# Patient Record
Sex: Male | Born: 1950 | Race: White | Hispanic: No | Marital: Married | Smoking: Former smoker
Health system: Southern US, Community
[De-identification: ages and names within clinical notes are randomized; demographics above are authoritative.]

## PROBLEM LIST (undated history)

## (undated) DIAGNOSIS — S31809A Unspecified open wound of unspecified buttock, initial encounter: Secondary | ICD-10-CM

## (undated) DIAGNOSIS — R6521 Severe sepsis with septic shock: Secondary | ICD-10-CM

## (undated) DIAGNOSIS — I4821 Permanent atrial fibrillation: Secondary | ICD-10-CM

## (undated) DIAGNOSIS — I4891 Unspecified atrial fibrillation: Secondary | ICD-10-CM

## (undated) DIAGNOSIS — E119 Type 2 diabetes mellitus without complications: Secondary | ICD-10-CM

## (undated) DIAGNOSIS — I10 Essential (primary) hypertension: Secondary | ICD-10-CM

## (undated) DIAGNOSIS — A419 Sepsis, unspecified organism: Secondary | ICD-10-CM

## (undated) DIAGNOSIS — I96 Gangrene, not elsewhere classified: Secondary | ICD-10-CM

## (undated) HISTORY — PX: TONSILLECTOMY: SUR1361

## (undated) HISTORY — PX: TESTICLE SURGERY: SHX794

## (undated) HISTORY — PX: IRRIGATION AND DEBRIDEMENT BUTTOCKS: SHX6601

---

## 2017-05-29 DIAGNOSIS — A419 Sepsis, unspecified organism: Secondary | ICD-10-CM

## 2017-05-29 DIAGNOSIS — S31809A Unspecified open wound of unspecified buttock, initial encounter: Secondary | ICD-10-CM

## 2017-05-29 HISTORY — DX: Sepsis, unspecified organism: A41.9

## 2017-05-29 HISTORY — DX: Unspecified open wound of unspecified buttock, initial encounter: S31.809A

## 2017-06-16 ENCOUNTER — Inpatient Hospital Stay
Admission: AD | Admit: 2017-06-16 | Discharge: 2017-06-23 | Disposition: A | Discharge status: Other Acute Inpatient Hospital | Payer: Self-pay | Source: Ambulatory Visit | Attending: Internal Medicine | Admitting: Internal Medicine

## 2017-06-17 LAB — CBC WITH DIFFERENTIAL/PLATELET
BASOS PCT: 0 %
Basophils Absolute: 0 10*3/uL (ref 0.0–0.1)
EOS ABS: 0.2 10*3/uL (ref 0.0–0.7)
EOS PCT: 2 %
HCT: 26.8 % — ABNORMAL LOW (ref 39.0–52.0)
Hemoglobin: 8.5 g/dL — ABNORMAL LOW (ref 13.0–17.0)
LYMPHS ABS: 1.1 10*3/uL (ref 0.7–4.0)
Lymphocytes Relative: 9 %
MCH: 30.8 pg (ref 26.0–34.0)
MCHC: 31.7 g/dL (ref 30.0–36.0)
MCV: 97.1 fL (ref 78.0–100.0)
MONOS PCT: 10 %
Monocytes Absolute: 1.3 10*3/uL — ABNORMAL HIGH (ref 0.1–1.0)
Neutro Abs: 10 10*3/uL — ABNORMAL HIGH (ref 1.7–7.7)
Neutrophils Relative %: 79 %
PLATELETS: 262 10*3/uL (ref 150–400)
RBC: 2.76 MIL/uL — AB (ref 4.22–5.81)
RDW: 17.1 % — ABNORMAL HIGH (ref 11.5–15.5)
WBC: 12.2 10*3/uL — AB (ref 4.0–10.5)

## 2017-06-17 LAB — COMPREHENSIVE METABOLIC PANEL
ALT: 23 U/L (ref 17–63)
AST: 25 U/L (ref 15–41)
Albumin: 1.4 g/dL — ABNORMAL LOW (ref 3.5–5.0)
Alkaline Phosphatase: 120 U/L (ref 38–126)
Anion gap: 8 (ref 5–15)
BUN: 19 mg/dL (ref 6–20)
CHLORIDE: 98 mmol/L — AB (ref 101–111)
CO2: 25 mmol/L (ref 22–32)
Calcium: 8 mg/dL — ABNORMAL LOW (ref 8.9–10.3)
Creatinine, Ser: 1.25 mg/dL — ABNORMAL HIGH (ref 0.61–1.24)
GFR, EST NON AFRICAN AMERICAN: 60 mL/min — AB (ref >60)
Glucose, Bld: 259 mg/dL — ABNORMAL HIGH (ref 65–99)
POTASSIUM: 5 mmol/L (ref 3.5–5.1)
Sodium: 131 mmol/L — ABNORMAL LOW (ref 135–145)
Total Bilirubin: 0.7 mg/dL (ref 0.3–1.2)
Total Protein: 5.3 g/dL — ABNORMAL LOW (ref 6.5–8.1)

## 2017-06-17 LAB — MAGNESIUM: MAGNESIUM: 1.5 mg/dL — AB (ref 1.7–2.4)

## 2017-06-19 LAB — BASIC METABOLIC PANEL
ANION GAP: 9 (ref 5–15)
BUN: 19 mg/dL (ref 6–20)
CALCIUM: 7.8 mg/dL — AB (ref 8.9–10.3)
CO2: 25 mmol/L (ref 22–32)
CREATININE: 1.08 mg/dL (ref 0.61–1.24)
Chloride: 99 mmol/L — ABNORMAL LOW (ref 101–111)
Glucose, Bld: 170 mg/dL — ABNORMAL HIGH (ref 65–99)
Potassium: 4.4 mmol/L (ref 3.5–5.1)
Sodium: 133 mmol/L — ABNORMAL LOW (ref 135–145)

## 2017-06-19 LAB — MAGNESIUM: Magnesium: 1.6 mg/dL — ABNORMAL LOW (ref 1.7–2.4)

## 2017-06-20 LAB — URINALYSIS, ROUTINE W REFLEX MICROSCOPIC
Bilirubin Urine: NEGATIVE
GLUCOSE, UA: NEGATIVE mg/dL
Hgb urine dipstick: NEGATIVE
Ketones, ur: NEGATIVE mg/dL
Leukocytes, UA: NEGATIVE
Nitrite: NEGATIVE
PROTEIN: 100 mg/dL — AB
Specific Gravity, Urine: 1.016 (ref 1.005–1.030)
pH: 5 (ref 5.0–8.0)

## 2017-06-20 LAB — MAGNESIUM: MAGNESIUM: 1.8 mg/dL (ref 1.7–2.4)

## 2017-06-21 LAB — URINE CULTURE: CULTURE: NO GROWTH

## 2017-06-22 LAB — CBC
HCT: 25.8 % — ABNORMAL LOW (ref 39.0–52.0)
HEMOGLOBIN: 8.1 g/dL — AB (ref 13.0–17.0)
MCH: 29.9 pg (ref 26.0–34.0)
MCHC: 31.4 g/dL (ref 30.0–36.0)
MCV: 95.2 fL (ref 78.0–100.0)
Platelets: 412 10*3/uL — ABNORMAL HIGH (ref 150–400)
RBC: 2.71 MIL/uL — ABNORMAL LOW (ref 4.22–5.81)
RDW: 16.3 % — ABNORMAL HIGH (ref 11.5–15.5)
WBC: 8.9 10*3/uL (ref 4.0–10.5)

## 2017-06-22 LAB — BASIC METABOLIC PANEL
Anion gap: 7 (ref 5–15)
BUN: 21 mg/dL — AB (ref 6–20)
CHLORIDE: 97 mmol/L — AB (ref 101–111)
CO2: 29 mmol/L (ref 22–32)
CREATININE: 1.09 mg/dL (ref 0.61–1.24)
Calcium: 8.4 mg/dL — ABNORMAL LOW (ref 8.9–10.3)
GFR calc Af Amer: >60 mL/min (ref >60)
GFR calc non Af Amer: >60 mL/min (ref >60)
GLUCOSE: 151 mg/dL — AB (ref 65–99)
POTASSIUM: 4.7 mmol/L (ref 3.5–5.1)
SODIUM: 133 mmol/L — AB (ref 135–145)

## 2017-06-22 LAB — C-REACTIVE PROTEIN: CRP: 19.1 mg/dL — ABNORMAL HIGH (ref <1.0)

## 2017-06-22 LAB — MAGNESIUM: MAGNESIUM: 1.7 mg/dL (ref 1.7–2.4)

## 2017-06-22 LAB — PHOSPHORUS: Phosphorus: 4 mg/dL (ref 2.5–4.6)

## 2017-06-22 LAB — SEDIMENTATION RATE: SED RATE: 130 mm/h — AB (ref 0–16)

## 2017-06-23 ENCOUNTER — Emergency Department (HOSPITAL_COMMUNITY): Payer: Medicare Other

## 2017-06-23 ENCOUNTER — Encounter (HOSPITAL_COMMUNITY): Payer: Self-pay | Admitting: General Practice

## 2017-06-23 ENCOUNTER — Inpatient Hospital Stay (HOSPITAL_COMMUNITY)
Admission: EM | Admit: 2017-06-23 | Discharge: 2017-06-27 | DRG: 981 | Disposition: A | Discharge status: Other Acute Inpatient Hospital | Payer: Medicare Other | Attending: Internal Medicine | Admitting: Internal Medicine

## 2017-06-23 DIAGNOSIS — E1140 Type 2 diabetes mellitus with diabetic neuropathy, unspecified: Secondary | ICD-10-CM | POA: Diagnosis present

## 2017-06-23 DIAGNOSIS — I481 Persistent atrial fibrillation: Secondary | ICD-10-CM | POA: Diagnosis present

## 2017-06-23 DIAGNOSIS — M549 Dorsalgia, unspecified: Secondary | ICD-10-CM | POA: Diagnosis present

## 2017-06-23 DIAGNOSIS — Z79899 Other long term (current) drug therapy: Secondary | ICD-10-CM

## 2017-06-23 DIAGNOSIS — I4821 Permanent atrial fibrillation: Secondary | ICD-10-CM | POA: Diagnosis present

## 2017-06-23 DIAGNOSIS — I2510 Atherosclerotic heart disease of native coronary artery without angina pectoris: Secondary | ICD-10-CM | POA: Diagnosis present

## 2017-06-23 DIAGNOSIS — G8929 Other chronic pain: Secondary | ICD-10-CM | POA: Diagnosis present

## 2017-06-23 DIAGNOSIS — I4510 Unspecified right bundle-branch block: Secondary | ICD-10-CM | POA: Diagnosis present

## 2017-06-23 DIAGNOSIS — N183 Chronic kidney disease, stage 3 (moderate): Secondary | ICD-10-CM | POA: Diagnosis present

## 2017-06-23 DIAGNOSIS — R1310 Dysphagia, unspecified: Secondary | ICD-10-CM | POA: Diagnosis present

## 2017-06-23 DIAGNOSIS — L89154 Pressure ulcer of sacral region, stage 4: Secondary | ICD-10-CM | POA: Diagnosis present

## 2017-06-23 DIAGNOSIS — F329 Major depressive disorder, single episode, unspecified: Secondary | ICD-10-CM | POA: Diagnosis present

## 2017-06-23 DIAGNOSIS — F419 Anxiety disorder, unspecified: Secondary | ICD-10-CM | POA: Diagnosis present

## 2017-06-23 DIAGNOSIS — Z6837 Body mass index (BMI) 37.0-37.9, adult: Secondary | ICD-10-CM

## 2017-06-23 DIAGNOSIS — D72829 Elevated white blood cell count, unspecified: Secondary | ICD-10-CM | POA: Diagnosis not present

## 2017-06-23 DIAGNOSIS — Z87891 Personal history of nicotine dependence: Secondary | ICD-10-CM | POA: Diagnosis not present

## 2017-06-23 DIAGNOSIS — E871 Hypo-osmolality and hyponatremia: Secondary | ICD-10-CM | POA: Diagnosis present

## 2017-06-23 DIAGNOSIS — E441 Mild protein-calorie malnutrition: Secondary | ICD-10-CM | POA: Diagnosis present

## 2017-06-23 DIAGNOSIS — I482 Chronic atrial fibrillation: Secondary | ICD-10-CM | POA: Diagnosis not present

## 2017-06-23 DIAGNOSIS — E1152 Type 2 diabetes mellitus with diabetic peripheral angiopathy with gangrene: Secondary | ICD-10-CM | POA: Diagnosis present

## 2017-06-23 DIAGNOSIS — R159 Full incontinence of feces: Secondary | ICD-10-CM | POA: Diagnosis present

## 2017-06-23 DIAGNOSIS — L8990 Pressure ulcer of unspecified site, unspecified stage: Secondary | ICD-10-CM | POA: Insufficient documentation

## 2017-06-23 DIAGNOSIS — N493 Fournier gangrene: Secondary | ICD-10-CM | POA: Diagnosis present

## 2017-06-23 DIAGNOSIS — G8220 Paraplegia, unspecified: Secondary | ICD-10-CM | POA: Diagnosis present

## 2017-06-23 DIAGNOSIS — E785 Hyperlipidemia, unspecified: Secondary | ICD-10-CM | POA: Diagnosis present

## 2017-06-23 DIAGNOSIS — G4730 Sleep apnea, unspecified: Secondary | ICD-10-CM | POA: Diagnosis present

## 2017-06-23 DIAGNOSIS — D539 Nutritional anemia, unspecified: Secondary | ICD-10-CM | POA: Diagnosis present

## 2017-06-23 DIAGNOSIS — R52 Pain, unspecified: Secondary | ICD-10-CM | POA: Diagnosis not present

## 2017-06-23 DIAGNOSIS — I129 Hypertensive chronic kidney disease with stage 1 through stage 4 chronic kidney disease, or unspecified chronic kidney disease: Secondary | ICD-10-CM | POA: Diagnosis present

## 2017-06-23 DIAGNOSIS — Z794 Long term (current) use of insulin: Secondary | ICD-10-CM

## 2017-06-23 DIAGNOSIS — S31809A Unspecified open wound of unspecified buttock, initial encounter: Secondary | ICD-10-CM | POA: Diagnosis present

## 2017-06-23 DIAGNOSIS — E1122 Type 2 diabetes mellitus with diabetic chronic kidney disease: Secondary | ICD-10-CM | POA: Diagnosis present

## 2017-06-23 DIAGNOSIS — E1165 Type 2 diabetes mellitus with hyperglycemia: Secondary | ICD-10-CM | POA: Diagnosis present

## 2017-06-23 HISTORY — DX: Gangrene, not elsewhere classified: I96

## 2017-06-23 HISTORY — DX: Type 2 diabetes mellitus without complications: E11.9

## 2017-06-23 HISTORY — DX: Unspecified atrial fibrillation: I48.91

## 2017-06-23 HISTORY — DX: Unspecified open wound of unspecified buttock, initial encounter: S31.809A

## 2017-06-23 HISTORY — DX: Severe sepsis with septic shock: R65.21

## 2017-06-23 HISTORY — DX: Permanent atrial fibrillation: I48.21

## 2017-06-23 HISTORY — DX: Essential (primary) hypertension: I10

## 2017-06-23 HISTORY — DX: Sepsis, unspecified organism: A41.9

## 2017-06-23 LAB — CBC WITH DIFFERENTIAL/PLATELET
BASOS ABS: 0 10*3/uL (ref 0.0–0.1)
Basophils Relative: 0 %
Eosinophils Absolute: 0.8 10*3/uL — ABNORMAL HIGH (ref 0.0–0.7)
Eosinophils Relative: 6 %
HCT: 29.2 % — ABNORMAL LOW (ref 39.0–52.0)
Hemoglobin: 9.2 g/dL — ABNORMAL LOW (ref 13.0–17.0)
LYMPHS ABS: 1.4 10*3/uL (ref 0.7–4.0)
Lymphocytes Relative: 11 %
MCH: 29.6 pg (ref 26.0–34.0)
MCHC: 31.5 g/dL (ref 30.0–36.0)
MCV: 93.9 fL (ref 78.0–100.0)
MONO ABS: 1.7 10*3/uL — AB (ref 0.1–1.0)
Monocytes Relative: 13 %
NEUTROS PCT: 70 %
Neutro Abs: 9.1 10*3/uL — ABNORMAL HIGH (ref 1.7–7.7)
PLATELETS: 413 10*3/uL — AB (ref 150–400)
RBC: 3.11 MIL/uL — ABNORMAL LOW (ref 4.22–5.81)
RDW: 15.9 % — AB (ref 11.5–15.5)
WBC: 13 10*3/uL — AB (ref 4.0–10.5)

## 2017-06-23 LAB — BASIC METABOLIC PANEL
ANION GAP: 9 (ref 5–15)
BUN: 25 mg/dL — ABNORMAL HIGH (ref 6–20)
CALCIUM: 8.5 mg/dL — AB (ref 8.9–10.3)
CO2: 26 mmol/L (ref 22–32)
CREATININE: 1.11 mg/dL (ref 0.61–1.24)
Chloride: 97 mmol/L — ABNORMAL LOW (ref 101–111)
Glucose, Bld: 139 mg/dL — ABNORMAL HIGH (ref 65–99)
Potassium: 4.8 mmol/L (ref 3.5–5.1)
SODIUM: 132 mmol/L — AB (ref 135–145)

## 2017-06-23 LAB — APTT: aPTT: 40 seconds — ABNORMAL HIGH (ref 24–36)

## 2017-06-23 LAB — GLUCOSE, CAPILLARY: GLUCOSE-CAPILLARY: 142 mg/dL — AB (ref 65–99)

## 2017-06-23 LAB — MAGNESIUM: Magnesium: 1.6 mg/dL — ABNORMAL LOW (ref 1.7–2.4)

## 2017-06-23 MED ORDER — HEPARIN SODIUM (PORCINE) 5000 UNIT/ML IJ SOLN
5000.0000 [IU] | Freq: Three times a day (TID) | INTRAMUSCULAR | Status: DC
  Administered 2017-06-24 – 2017-06-25 (×2): 5000 [IU] via SUBCUTANEOUS
  Filled 2017-06-23 (×2): qty 1

## 2017-06-23 MED ORDER — MORPHINE SULFATE (PF) 2 MG/ML IV SOLN
2.0000 mg | Freq: Four times a day (QID) | INTRAVENOUS | Status: DC | PRN
  Administered 2017-06-25 – 2017-06-27 (×6): 2 mg via INTRAVENOUS
  Filled 2017-06-23 (×7): qty 1

## 2017-06-23 MED ORDER — METOPROLOL TARTRATE 12.5 MG HALF TABLET
12.5000 mg | ORAL_TABLET | Freq: Four times a day (QID) | ORAL | Status: DC
  Administered 2017-06-23 – 2017-06-24 (×3): 12.5 mg via ORAL
  Filled 2017-06-23 (×3): qty 1

## 2017-06-23 MED ORDER — VITAMIN C 500 MG PO TABS
500.0000 mg | ORAL_TABLET | Freq: Two times a day (BID) | ORAL | Status: DC
  Administered 2017-06-24 – 2017-06-27 (×6): 500 mg via ORAL
  Filled 2017-06-23 (×6): qty 1

## 2017-06-23 MED ORDER — CEFOTETAN DISODIUM-DEXTROSE 2-2.08 GM-% IV SOLR: 2.0000 g | INTRAVENOUS | Status: DC

## 2017-06-23 MED ORDER — OXYCODONE HCL 5 MG PO TABS
5.0000 mg | ORAL_TABLET | Freq: Four times a day (QID) | ORAL | Status: DC | PRN
  Administered 2017-06-23: 5 mg via ORAL
  Filled 2017-06-23 (×2): qty 1

## 2017-06-23 MED ORDER — ACETAMINOPHEN 325 MG PO TABS: 650.0000 mg | ORAL_TABLET | Freq: Four times a day (QID) | ORAL | Status: DC | PRN

## 2017-06-23 MED ORDER — ALPRAZOLAM 0.5 MG PO TABS
0.5000 mg | ORAL_TABLET | Freq: Three times a day (TID) | ORAL | Status: DC | PRN
  Administered 2017-06-23 – 2017-06-26 (×5): 0.5 mg via ORAL
  Filled 2017-06-23 (×5): qty 1

## 2017-06-23 MED ORDER — ZINC SULFATE 220 (50 ZN) MG PO CAPS
220.0000 mg | ORAL_CAPSULE | Freq: Every day | ORAL | Status: DC
  Administered 2017-06-25 – 2017-06-27 (×3): 220 mg via ORAL
  Filled 2017-06-23 (×3): qty 1

## 2017-06-23 MED ORDER — HYDROMORPHONE HCL 1 MG/ML IJ SOLN
1.0000 mg | Freq: Once | INTRAMUSCULAR | Status: AC
  Administered 2017-06-23: 1 mg via INTRAVENOUS
  Filled 2017-06-23: qty 1

## 2017-06-23 MED ORDER — DOCUSATE SODIUM 100 MG PO CAPS
100.0000 mg | ORAL_CAPSULE | Freq: Every day | ORAL | Status: DC
Start: 2017-06-24 — End: 2017-06-25

## 2017-06-23 MED ORDER — ATORVASTATIN CALCIUM 40 MG PO TABS
40.0000 mg | ORAL_TABLET | Freq: Every day | ORAL | Status: DC
  Administered 2017-06-25 – 2017-06-27 (×3): 40 mg via ORAL
  Filled 2017-06-23 (×3): qty 1

## 2017-06-23 MED ORDER — ACETAMINOPHEN 650 MG RE SUPP: 650.0000 mg | Freq: Four times a day (QID) | RECTAL | Status: DC | PRN

## 2017-06-23 MED ORDER — POLYETHYLENE GLYCOL 3350 17 G PO PACK
17.0000 g | PACK | Freq: Every day | ORAL | Status: DC | PRN
  Administered 2017-06-23 (×2): 17 g via ORAL
  Filled 2017-06-23: qty 1

## 2017-06-23 MED ORDER — MORPHINE SULFATE (PF) 4 MG/ML IV SOLN: 6.0000 mg | Freq: Once | INTRAVENOUS | Status: DC

## 2017-06-23 MED ORDER — ATORVASTATIN CALCIUM 20 MG PO TABS: 20.0000 mg | ORAL_TABLET | Freq: Every day | ORAL | Status: DC

## 2017-06-23 MED ORDER — INSULIN ASPART 100 UNIT/ML ~~LOC~~ SOLN: 0.0000 [IU] | Freq: Every day | SUBCUTANEOUS | Status: DC

## 2017-06-23 MED ORDER — HEPARIN SODIUM (PORCINE) 5000 UNIT/ML IJ SOLN
5000.0000 [IU] | Freq: Three times a day (TID) | INTRAMUSCULAR | Status: AC
  Administered 2017-06-23: 5000 [IU] via SUBCUTANEOUS
  Filled 2017-06-23: qty 1

## 2017-06-23 MED ORDER — FLUOXETINE HCL 20 MG PO CAPS
20.0000 mg | ORAL_CAPSULE | Freq: Two times a day (BID) | ORAL | Status: DC
  Administered 2017-06-24 – 2017-06-27 (×6): 20 mg via ORAL
  Filled 2017-06-23 (×6): qty 1

## 2017-06-23 MED ORDER — ASPIRIN 325 MG PO TABS: 325.0000 mg | ORAL_TABLET | Freq: Every day | ORAL | Status: DC

## 2017-06-23 MED ORDER — FAMOTIDINE 20 MG PO TABS
20.0000 mg | ORAL_TABLET | Freq: Two times a day (BID) | ORAL | Status: DC
Start: 2017-06-24 — End: 2017-06-27
  Administered 2017-06-24 – 2017-06-27 (×6): 20 mg via ORAL
  Filled 2017-06-23 (×6): qty 1

## 2017-06-23 MED ORDER — ACETAMINOPHEN 325 MG PO TABS
650.0000 mg | ORAL_TABLET | Freq: Four times a day (QID) | ORAL | Status: DC | PRN
  Administered 2017-06-25: 650 mg via ORAL
  Filled 2017-06-23: qty 2

## 2017-06-23 MED ORDER — ASPIRIN 81 MG PO CHEW
81.0000 mg | CHEWABLE_TABLET | Freq: Every day | ORAL | Status: DC
  Administered 2017-06-23 – 2017-06-27 (×4): 81 mg via ORAL
  Filled 2017-06-23 (×4): qty 1

## 2017-06-23 MED ORDER — INSULIN ASPART 100 UNIT/ML ~~LOC~~ SOLN
0.0000 [IU] | Freq: Three times a day (TID) | SUBCUTANEOUS | Status: DC
  Administered 2017-06-24 – 2017-06-25 (×2): 4 [IU] via SUBCUTANEOUS

## 2017-06-23 NOTE — Consult Note (Signed)
Reason for Consult:  Request for diverting colostomy Referring Physician: Dr. Owens Shark, [facility] hospital  Alex Abbott is an 66 y.o. male.  HPI: Patient is a 66 year old male who presented to [facility] on 05/30/17 with Fournier's gangrene. He was taken the operating room and septic shock on this date underwent his first debridement. On 05/31/16 he was taken back for second look had a left orchiectomy and a suprapubic catheter placed. On 75 and underwent further debridement of the buttocks. He was returned again on 06/06/17 for debridement. After his debridement on 06/06/17 when he woke he was found to have some paralysis and workup led to finding of a spinal cord infarct with paraplegia. On 06/10/17 he was taken back to the OR and a right thigh pouch was made for his remaining right testicle. Plastics consult at that time recommended partial closure and skin grafting to the penis. On 06/11/17 he underwent rapid response for severe anemia was transfused for hemoglobin of 5.8 up to 8.8. Additional issues include adult-onset diabetes mellitus, atrial fibrillation and obesity. He was stabilized and discharged on 06/16/17 to the LTAC here at [facility]. They are asking we see him and evaluate for possible diverting colostomy.   Past medical history: Fournier's gangrene Chronic atrial fibrillation requiring amiodarone drip Chronic kidney disease stage III GFR 30-59 mils per minute Type 2 diabetes with neuropathy Anemia. Coronary artery disease Hypertension History of bipolar affective disorder/anxiety disorder History tobacco use quit 08/29/12    Past surgical history: None currently available in the records:  None until 05/30/17  Family history: His mother had hypertension and heart failure. One brother with history of cancer  Social History:  has no tobacco, alcohol, and drug history on file. Tobacco:  Up to 2 PPD x 50 plus years ETOH:  None currently, heavy use for 40  years Drugs:  He has tried most of them, none for years.   Allergies:  Propafenone (Rythmol)  Prior to Admission medications   Not on File    Current Facility-Administered Medications:  .  morphine 4 MG/ML injection 6 mg, 6 mg, Intravenous, Once, Winfrey, Jenne Pane, MD No current outpatient prescriptions on file.   Current meds [facility]: Lantus 20 units twice a day MiraLAX 17 g daily Humalog sliding scale Singh sulfate 220 mg daily Vitamin C 500 mg daily pro-stat nutritional liquids 30 mils twice a day  Glucerna shakes 237 Mls daily  mag sulfate1 g  Potassium chloride 40 mEq daily Morphine sulfate 2 mg every 6 hours when necessary Mag oxide 400 mg every 8 hours Kayexalate 15 g per 60 mils 30 g when necessary  Tylenol when necessary  Pepcid 20 mg daily D51/2 NS at 50 ml per hour Xanax 0.5 mg 3 times a day when necessary oxycodone IR 5 mg every 6 hours when necessary  heparin 5000 units every 8 hours subcutaneous Prozac 20 mg twice a day Aspirin 325 mg daily Colace 100 mg daily Atorvastatin 20 mg daily Metoprolol tartrate 25 mg tablets half tablet 4 times a day  Results for orders placed or performed during the hospital encounter of 06/16/17 (from the past 48 hour(s))  CBC     Status: Abnormal   Collection Time: 06/22/17  7:15 AM  Result Value Ref Range   WBC 8.9 4.0 - 10.5 K/uL   RBC 2.71 (L) 4.22 - 5.81 MIL/uL   Hemoglobin 8.1 (L) 13.0 - 17.0 g/dL   HCT 25.8 (L) 39.0 - 52.0 %  MCV 95.2 78.0 - 100.0 fL   MCH 29.9 26.0 - 34.0 pg   MCHC 31.4 30.0 - 36.0 g/dL   RDW 16.3 (H) 11.5 - 15.5 %   Platelets 412 (H) 150 - 400 K/uL  Basic metabolic panel     Status: Abnormal   Collection Time: 06/22/17  7:15 AM  Result Value Ref Range   Sodium 133 (L) 135 - 145 mmol/L   Potassium 4.7 3.5 - 5.1 mmol/L   Chloride 97 (L) 101 - 111 mmol/L   CO2 29 22 - 32 mmol/L   Glucose, Bld 151 (H) 65 - 99 mg/dL   BUN 21 (H) 6 - 20 mg/dL   Creatinine, Ser 1.09 0.61 - 1.24  mg/dL   Calcium 8.4 (L) 8.9 - 10.3 mg/dL   GFR calc non Af Amer >60 >60 mL/min   GFR calc Af Amer >60 >60 mL/min    Comment: (NOTE) The eGFR has been calculated using the CKD EPI equation. This calculation has not been validated in all clinical situations. eGFR's persistently <60 mL/min signify possible Chronic Kidney Disease.    Anion gap 7 5 - 15  Magnesium     Status: None   Collection Time: 06/22/17  7:15 AM  Result Value Ref Range   Magnesium 1.7 1.7 - 2.4 mg/dL  Phosphorus     Status: None   Collection Time: 06/22/17  7:15 AM  Result Value Ref Range   Phosphorus 4.0 2.5 - 4.6 mg/dL  C-reactive protein     Status: Abnormal   Collection Time: 06/22/17  7:15 AM  Result Value Ref Range   CRP 19.1 (H) <1.0 mg/dL  Sedimentation rate     Status: Abnormal   Collection Time: 06/22/17  7:15 AM  Result Value Ref Range   Sed Rate 130 (H) 0 - 16 mm/hr    Dg Chest Portable 1 View  Result Date: 06/23/2017 CLINICAL DATA:  Status post PICC line placement on the right. EXAM: PORTABLE CHEST 1 VIEW COMPARISON:  None in PACs FINDINGS: The PICC line tip terminates at the junction of the right and left brachiocephalic veins. The lungs are adequately inflated. There is no pneumothorax. The retrocardiac region on the left is dense. The cardiac silhouette is enlarged. The pulmonary vascularity is mildly prominent centrally. IMPRESSION: No postprocedure complication following right-sided PICC line placement. The tip of the catheter however terminates at the level of the junction of the right and left brachiocephalic veins. Cardiomegaly with central pulmonary vascular prominence may reflect mild CHF. Electronically Signed   By: David  Martinique M.D.   On: 06/23/2017 11:15    Review of Systems  Constitutional: Negative.   HENT: Negative.   Eyes: Negative for blurred vision, double vision, photophobia, pain, discharge and redness.       Losing vision right eye, secondary to Diabetes, wears glasses.     Respiratory: Positive for cough and shortness of breath. Negative for hemoptysis, sputum production and wheezing.   Cardiovascular: Positive for orthopnea, leg swelling and PND.       Hx of AF, but no cardiac procedures.  Gastrointestinal: Positive for heartburn. Negative for abdominal pain, blood in stool, constipation, diarrhea, melena, nausea and vomiting.  Genitourinary:       He cannot feel below his umbilicus.  Suprapubic catheter in place.    Musculoskeletal:       Paraplegia, no sensation below the umbilicus  Skin: Negative.   Neurological:       Paraplegia, no sensation below  the umbilicus.  He cannot move either leg.  No sensation in his legs.  Endo/Heme/Allergies: Negative.   Psychiatric/Behavioral: Positive for depression. The patient is nervous/anxious.    Blood pressure 109/77, pulse 90, temperature (!) 97.3 F (36.3 C), temperature source Oral, resp. rate (!) 22, height 6' (1.829 m), weight 127 kg (280 lb), SpO2 100 %. Physical Exam  Constitutional: He is oriented to person, place, and time. No distress.  Overtly obese male, in no acute distress. No sensation or pain, no motor function below the umbilicus.  HENT:  Head: Normocephalic and atraumatic.  Mouth/Throat: No oropharyngeal exudate.  Eyes: Right eye exhibits no discharge. Left eye exhibits no discharge. No scleral icterus.  Pupils are equal  Neck: Normal range of motion. Neck supple. No JVD present. No tracheal deviation present. No thyromegaly present.  Cardiovascular: Normal rate, regular rhythm, normal heart sounds and intact distal pulses.   No murmur heard. Respiratory: Effort normal and breath sounds normal. No respiratory distress. He has no wheezes. He has no rales. He exhibits no tenderness.  Decreased BS both bases  GI: Soft. Bowel sounds are normal. He exhibits no distension and no mass. There is no tenderness. There is no rebound and no guarding.  Suprapubic catheter in place, with foley catheter  sewed in.  Some redness from the site.  Genitourinary:  Genitourinary Comments: Extensive dissection as noted below and pictures. There is no rectal tone in the rectum which remains intact. Penis has been debrid.  Musculoskeletal: He exhibits no tenderness or deformity. Edema: 2-3 edema right leg is worse than the right.  Lymphadenopathy:    He has no cervical adenopathy.  Neurological: He is alert and oriented to person, place, and time. No cranial nerve deficit.  No sensation below the umbilicus.  No motor function currently.    Skin: Skin is warm. He is not diaphoretic.  He has had extensive dissection of the anterior  perineum, the penis, the scrotum, both the left and right buttocks up to the up to the coccyx.  The upper portion of the wound will need further debridement.  See pictures below. He has early issues with recurrent taping of the sites.    Psychiatric: He has a normal mood and affect. His behavior is normal. Judgment and thought content normal.       Anterior view:  total depth 10 cm Area has purulent drainage.      Left side down .  Left side down  Coccyx at 9 o'clock     Assessment/Plan: Fournier's gangrene with extensive invasion of the perineum, the penis, left and right buttocks, and the sacrum Sepsis secondary to Fournier's Spinal cord infarction with paraplegia, no motor or sensation below the umbilicus. 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 Chronic atrial fibrillation - aspirin currently for anticoagulation he has declined other anticoagulants CAD - no reported history of MI or prior interventional treatments. Type 2 diabetes - poorly controlled. He reports glucoses in the 500 range prior to hospitalization Hypertension Hyperlipidemia Morbid obesity Probable sleep apnea History of heavy tobacco and alcohol use in the past Remote history of drug use History of bipolar disorder and anxiety disorder. Acute  anemia Hyponatremia  Plan: Patient Referred from [facility] for diverting colostomy. History and exam extensively reviewed by Dr. Windle Guard. Patient will be admitted to the medicine service. We will have wound care nurse see and marked the patient for diverting colostomy. We'll begin twice a day wound care here on  the floor along with hydrotherapy. If medically stable will plan on laparoscopic diverting colostomy tomorrow. He can eat this afternoon, nothing by mouth after midnight. Currently they're having issues getting his pain medications and because of problems with IV access.  , 06/23/2017, 11:30 AM

## 2017-06-23 NOTE — Consult Note (Addendum)
Surgical team following for assessment and plan of care to full thickness wounds to sacrum, perineum, and near penis.  Discussed with PA and additional recommendations provided in the wound care orders.  WOC Nurse requested for preoperative stoma site marking  Discussed surgical procedure and stoma creation with patient.  Explained role of the WOC nurse team.   Answered patient questions. Examined patient while lying in bed in the ER.  He is unable to sit upright or stand, placed head of stretcher up as far as he could tolerate in order to place the marking in the patient's visual field, away from any creases or abdominal contour issues and within the rectus muscle.  Unable to mark below the patient's belt line.   Marked for colostomy in the LLQ  __6__ cm to the left of the umbilicus and __6__cm above the umbilicus.  Patient's abdomen cleansed with CHG wipes at site markings, allowed to air dry prior to marking.Covered mark with thin film transparent dressing to preserve mark until date of surgery. WOC team will plan to follow after surgery for teaching sessions. Cammie Mcgeeawn Charliene Inoue MSN, RN, CWOCN, [place]CN-AP, CNS [phone]

## 2017-06-23 NOTE — H&P (Addendum)
Date: 06/23/2017               Patient Name:  Alex Abbott MRN: [id number]  DOB: August 22, 1951 Age / Sex: 66 y.o., male   PCP: Patient, No Pcp Per         Medical Service: Internal Medicine Teaching Service         Attending Physician: Dr. Earl Lagos, MD    First Contact: Zeb Comfort Pager: [phone]  Second Contact: Dr. John Giovanni Pager: [phone]       After Hours (After 5p/  First Contact Pager: [phone]  weekends / holidays): Second Contact Pager: [phone]   Chief Complaint: non-healing wound   History of Present Illness: Patient is a 66 year old male with history of poorly controlled t2dm, rate-controlled a-fib not on anticoagulation, and chronic pain presenting pre-op prior to diverting colostomy. Patient presented to OSH with Fournier's gangrene of buttocks and groin on 05/30/2017, and underwent a total of five debridements and most recently a pouch creation for his remaining R testicle prior to discharge on 07/19 to the LTAC here at [facility]. LTAC has requested evaluation for possible diverting colostomy as they are concerned for rectal incontinence contributing to ongoing poor wound healing. He has been seen and evaluated by surgery who plan to take him to the OR tomorrow for this procedure.   Today, he also complains of myalgias, general malaise, and dysphagia starting this morning. He experienced subjective chills yesterday but has not had measured fevers. He denies other symptoms on ROS.   Meds:  Current Meds  Medication Sig  . acetaminophen (TYLENOL) 325 MG tablet Take 650 mg by mouth every 6 (six) hours as needed for mild pain.  Marland Kitchen ALPRAZolam (XANAX) 0.5 MG tablet Take 0.5 mg by mouth 3 (three) times daily as needed for anxiety.  . Amino Acids-Protein Hydrolys (PRO-STAT PO) Take 30 mLs by mouth 2 (two) times daily.  Marland Kitchen aspirin 325 MG tablet Take 325 mg by mouth daily.  Marland Kitchen atorvastatin (LIPITOR) 10 MG tablet Take 10 mg by mouth daily.  Marland Kitchen docusate sodium  (COLACE) 100 MG capsule Take 100 mg by mouth daily.  . famotidine (PEPCID) 20 MG tablet Take 20 mg by mouth 2 (two) times daily.  Marland Kitchen FLUoxetine (PROZAC) 20 MG capsule Take 20 mg by mouth 2 (two) times daily.  Marland Kitchen glucagon (GLUCAGON EMERGENCY) 1 MG injection Inject 1 mg into the vein once as needed.  . insulin glargine (LANTUS) 100 UNIT/ML injection Inject 20 Units into the skin 2 (two) times daily.  . insulin lispro (HUMALOG) 100 UNIT/ML injection Inject 0-16 Units into the skin See admin instructions. Per sliding scale for BS 70-150=0 units, 151-200=4 units, 201-250=8 units, 251-300=10 units, 301-350=12 units, 351-400=16 units, BG>400 give 16 units and call MD  . magnesium oxide (MAG-OX) 400 MG tablet Take 400 mg by mouth every 6 (six) hours as needed.  . metoprolol tartrate (LOPRESSOR) 25 MG tablet Take 12.5 mg by mouth 4 (four) times daily.  Marland Kitchen oxycodone (OXY-IR) 5 MG capsule Take 5 mg by mouth every 6 (six) hours as needed for pain.  . polyethylene glycol (MIRALAX / GLYCOLAX) packet Take 17 g by mouth 2 (two) times daily.  . potassium chloride SA (K-DUR,KLOR-CON) 20 MEQ tablet Take 40 mEq by mouth every 4 (four) hours as needed (supplement).  . sodium polystyrene (KAYEXALATE) 15 GM/60ML suspension Take 30 g by mouth as needed.  . vitamin C (ASCORBIC ACID) 500 MG tablet Take 500 mg by mouth  2 (two) times daily.  Marland Kitchen. zinc sulfate 220 (50 Zn) MG capsule Take 220 mg by mouth daily.   Allergies: Allergies as of 06/23/2017 - Review Complete 06/23/2017  Allergen Reaction Noted  . Propafenone Other (See Comments) 09/17/2014   No past medical history on file.  Family History: Not contributory  Social History: [place] by [place] in [place], divorced, retired trucker/business Nature conservation officerowner/sales rep. Former smoker.   Review of Systems: A complete ROS was negative except as per HPI.   Physical Exam: Blood pressure 118/81, pulse 95, temperature (!) 97.3 F (36.3 C), temperature source Oral,  resp. rate (!) 21, height 6' (1.829 m), weight 127 kg (280 lb), SpO2 100 %. Physical Exam  Constitutional: He is oriented to person, place, and time. He appears well-developed and well-nourished. No distress.  Morbidly obese   HENT:  Head: Normocephalic and atraumatic.  Mouth/Throat: Oropharynx is clear and moist. No oropharyngeal exudate.  Eyes: Right eye exhibits no discharge. Left eye exhibits no discharge.  Neck: Normal range of motion. No tracheal deviation present.  Cardiovascular: Intact distal pulses.   Difficult to clearly appreciate heart sounds due to very large body habitus.  Pulmonary/Chest: Effort normal and breath sounds normal. No stridor. No respiratory distress. He has no wheezes.  Abdominal: Soft. Bowel sounds are normal. He exhibits no distension.  Genitourinary:  Genitourinary Comments: Sequelae of Fournier's gangrene. Wrap and packing in place on anterior aspect of wound. Granulation tissue without active purulent discharge. Similar appearance of posterior aspect of wound. No tenderness or erythema at wound edges.  Musculoskeletal: Normal range of motion. He exhibits edema. He exhibits no tenderness or deformity.  2+ pitting edema bilaterally to mid thighs.  Neurological: He is alert and oriented to person, place, and time.  Skin: Skin is warm and dry. He is not diaphoretic.   EKG: personally reviewed my interpretation is a-fib, ventricular rate 98 bpm, and RBBB  CXR: not personally reviewed.  IMPRESSION: No postprocedure complication following right-sided PICC line placement. The tip of the catheter however terminates at the level of the junction of the right and left brachiocephalic veins. Cardiomegaly with central pulmonary vascular prominence may reflect mild CHF.  Electronically Signed   By: David  SwazilandJordan M.D.   On: 06/23/2017 11:15  Assessment & Plan by Problem: Active Problems:   Buttock wound  1. Chronic non-healing Fournier gangrene: In agreement  with LTAC and surgery, likely to benefit from diverting procedure. Surgery following, appreciate recs. WBC elevated to 13K, but patient afebrile and non-septic appearance, no s/s of systemic infection. Will not start abx at this time. WOC nurse evaluated patient and helped patient with current wound and explained care of possible future ostomy. Has suprapubic cath in place to help with wound healing with prior c/o urinary incontinence.   - NPO at midnight, regular diet until then  - No abx indicated at this time  - f/u repeat CBC in am - Keep suprapubic cath in place for urinary diversion  - PT/OT evals  - WOCN following   2. Uncontrolled t2dm: Contributing to poor healing. Last A1c in March was 13.8.  - f/u repeat A1c - SSI-R with HS coverage  - Hold home glyburide + metformin    3. Dysphagia: Centor score 0. Continue to monitor.   4. Hypoalbuminemia and nutrition: Significant peripheral edema today with evidence of 2+ to bilateral mid-thighs. [facility] showed normal EF estimated at 60-65%. No prior history of heart failure. On review on  prior records, found to be significantly hypoalbuminemic to 1.4 on 07/20. Rechecking a number of labs including CMP, will f/u albumin values.  - f/u repeat CMP  - nutrition consult  - vit C, zinc supplements  5. A-fib: CHADS-VASC = 1. Patient in a-fib on monitor not rate controlled, had only taken 1 of his apparently 4 doses of metoprolol this morning. Will continue home dose and assess whether he is well-controlled on that and will change to equivalent BID or XR dose for compliance.  - Continue home metop 12.5 mg po q6hr - Change aspirin to 81 mg po   6. Pain control: Pain controlled with oxycodone and morphine per prior records.  - Continue oxycodone IR 5 mg q6hr PRN - Continue morphine 2 mg IV q6hr PRN   7. MDD and anxiety: Uses xanax and prozac, continuing home meds. Per prior records anxiety is a significant contributor to  ongoing distress.  - Prozac 20 mg PO BID  - Xanax 0.5 mg PO TID PRN   8. ASCVD risk: 10-year ASCVD score = 22.6%. Most recent lipid profile from March 2018 was triglyceride 229, cholesterol 215, HDL 36, LDL 133. Currently on lipitor 20 mg, will increase to 40 mg.  - Increase to lipitor 40 mg PO    Dispo: Admit patient to Inpatient with expected length of stay greater than 2 midnights.  Signed: Zeb ComfortSaripalli, Srinivas, Medical Student 06/23/2017, 3:34 PM  Pager: [phone]  Attestation for Student Documentation:  I personally was present and performed or re-performed the history, physical exam and medical decision-making activities of this service and have verified that the service and findings are accurately documented in the student's note.  John Giovanniathore, Tanasia Budzinski, MD 06/23/2017, 7:01 PM

## 2017-06-23 NOTE — ED Notes (Signed)
Surgery at bedside.

## 2017-06-23 NOTE — ED Triage Notes (Signed)
Patient comes from [facility]. Patient a/ox4. Admin to [facility] on 19th from Acute Care Facility. Admitted for wound care of gangrene on groin area. Debridement done multiple times. Suprapubic catheter placed for wound healing and plan for diverting colostomy to help with wound healing of groin. Wound goes all the way back to his coccyx. Patient has bowel incontinence. They attempted flexiseal but d/t hard stool, flexiseal wound come out. PICC line 2 lumen in place. Hx HTN, DM. Last ate 8:30am today. Daily meds given. V/s 97.3 oral, bp 110/75, HR 72, RR 20, 100% RA.

## 2017-06-23 NOTE — ED Provider Notes (Signed)
[facility]PT Provider Note   CSN: [id number] Arrival date & time: 06/23/17  [id number]     History   Chief Complaint No chief complaint on file.   HPI Alex Abbott is a 66 y.o. male.  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  In rehab the gangrene is not  despite aggressive debridement and wound care.  His medical team feels his ostomy site is contaminating the wounds and must be diverted in order for him to progress.  The patient agrees with this and is willing to have whatever procedure he can to improve his situation.  Pt is in no acute distress, has no increased shortness of breath, does admit to a brief episode of chest pain earlier in the day that resolved in a few seconds.  Pt admits to feeling down lately about his whole situation.        No past medical history on file.  There are no active problems to display for this patient.   No past surgical history on file.     Home Medications    Prior to Admission medications   Not on File    Family History No family history on file.  Social History Social History  Substance Use Topics  . Smoking status: Not on file  . Smokeless tobacco: Not on file  . Alcohol use Not on file     Allergies   Patient has no allergy information on record.   Review of Systems Review of Systems  Constitutional: Positive for fatigue. Negative for chills and fever.  HENT: Negative for ear pain and sore throat.   Eyes: Negative for pain and visual disturbance.  Respiratory: Negative for cough and shortness of breath.   Cardiovascular: Positive for chest pain. Negative for palpitations.  Gastrointestinal: Negative for abdominal pain and vomiting.  Genitourinary: Negative for dysuria and hematuria.  Musculoskeletal: Negative for  arthralgias and back pain.  Skin: Negative for color change and rash.  Neurological: Negative for seizures and syncope.  All other systems reviewed and are negative.    Physical Exam Updated Vital Signs BP (!) 111/98   Pulse 93 Comment: Simultaneous filing. User may not have seen previous data.  Temp (!) 97.3 F (36.3 C) (Oral)   Resp 20   Ht 6' (1.829 m)   Wt 127 kg (280 lb)   SpO2 100% Comment: Simultaneous filing. User may not have seen previous data.  BMI 37.97 kg/m   Physical Exam  Constitutional: He is oriented to person, place, and time. He appears well-developed and well-nourished.  HENT:  Head: Normocephalic and atraumatic.  Eyes: Conjunctivae are normal.  Neck: Neck supple.  Cardiovascular: Normal rate and regular rhythm.   No murmur heard. Pulmonary/Chest: Effort normal and breath sounds normal. No respiratory distress.  Abdominal: Soft. There is no tenderness.  Musculoskeletal: He exhibits no edema.  Neurological: He is alert and oriented to person, place, and time.  Pt paralyzed from waist down, only recently regained movement in upper extremities  Skin: Skin is warm and dry.  Psychiatric: He has a normal mood and affect.  Nursing note and vitals reviewed.  Media Information     Document Information   Photos    06/23/2017 12:16  Attached To:  Hospital Encounter on 06/23/17  Source Information   Sherrie GeorgeJennings, [place], [place]-C  [facility]  Media Information     Document Information   Photos  Left buttocks down  06/23/2017 12:23  Attached To:  Hospital Encounter on 06/23/17  Source Information   Sherrie GeorgeJennings, Willard, PA-C  [facility]   Media Information     Document Information   Photos  Coccyx at 9 o'clock position. He is lying on left side.   06/23/2017 12:25  Attached To:  Hospital Encounter on 06/23/17  Source Information   Sherrie GeorgeJennings, Willard, PA-C  [facility]     ED Treatments / Results  Labs (all labs  ordered are listed, but only abnormal results are displayed) Labs Reviewed  CBC WITH DIFFERENTIAL/PLATELET  BASIC METABOLIC PANEL    EKG  EKG Interpretation None       Radiology No results found.  Procedures Procedures (including critical care time)  Medications Ordered in ED Medications - No data to display   Initial Impression / Assessment and Plan / ED Course  I have reviewed the triage vital signs and the nursing notes.  Pertinent labs & imaging results that were available during my care of the patient were reviewed by me and considered in my medical decision making (see chart for details).  Gangrene, non healing -Basic Labs -ECG -Consult placed to surgery -Diverting colostomy procedure scheduled for tomorrow -Admit to internal medicine unassigned    Final Clinical Impressions(s) / ED Diagnoses   Final diagnoses:  None    New Prescriptions New Prescriptions   No medications on file     Angelita InglesWinfrey, Keeton Kassebaum B, MD 06/23/17 1308    Angelita InglesWinfrey, Malikhi Ogan B, MD 06/23/17 1322    Jacalyn LefevreHaviland, Julie, MD 06/23/17 1411

## 2017-06-24 ENCOUNTER — Inpatient Hospital Stay (HOSPITAL_COMMUNITY): Payer: Medicare Other | Admitting: Certified Registered"

## 2017-06-24 ENCOUNTER — Encounter (HOSPITAL_COMMUNITY)
Admission: EM | Disposition: A | Discharge status: Nursing Home (Includes ICF) | Payer: Self-pay | Source: Home / Self Care | Attending: Internal Medicine

## 2017-06-24 ENCOUNTER — Encounter (HOSPITAL_COMMUNITY): Payer: Self-pay | Admitting: Certified Registered"

## 2017-06-24 DIAGNOSIS — I482 Chronic atrial fibrillation: Secondary | ICD-10-CM

## 2017-06-24 DIAGNOSIS — L8990 Pressure ulcer of unspecified site, unspecified stage: Secondary | ICD-10-CM | POA: Insufficient documentation

## 2017-06-24 DIAGNOSIS — I4821 Permanent atrial fibrillation: Secondary | ICD-10-CM

## 2017-06-24 DIAGNOSIS — N493 Fournier gangrene: Principal | ICD-10-CM

## 2017-06-24 HISTORY — DX: Permanent atrial fibrillation: I48.21

## 2017-06-24 HISTORY — PX: COLON RESECTION: SHX5231

## 2017-06-24 HISTORY — PX: COLOSTOMY: SHX63

## 2017-06-24 LAB — CBC
HCT: 27.4 % — ABNORMAL LOW (ref 39.0–52.0)
HEMOGLOBIN: 8.3 g/dL — AB (ref 13.0–17.0)
MCH: 28.2 pg (ref 26.0–34.0)
MCHC: 30.3 g/dL (ref 30.0–36.0)
MCV: 93.2 fL (ref 78.0–100.0)
PLATELETS: 466 10*3/uL — AB (ref 150–400)
RBC: 2.94 MIL/uL — AB (ref 4.22–5.81)
RDW: 16 % — ABNORMAL HIGH (ref 11.5–15.5)
WBC: 13.2 10*3/uL — ABNORMAL HIGH (ref 4.0–10.5)

## 2017-06-24 LAB — COMPREHENSIVE METABOLIC PANEL
ALK PHOS: 183 U/L — AB (ref 38–126)
ALT: 37 U/L (ref 17–63)
AST: 28 U/L (ref 15–41)
Albumin: 1.2 g/dL — ABNORMAL LOW (ref 3.5–5.0)
Anion gap: 7 (ref 5–15)
BUN: 21 mg/dL — AB (ref 6–20)
CALCIUM: 8.1 mg/dL — AB (ref 8.9–10.3)
CHLORIDE: 98 mmol/L — AB (ref 101–111)
CO2: 27 mmol/L (ref 22–32)
CREATININE: 0.98 mg/dL (ref 0.61–1.24)
GFR calc non Af Amer: >60 mL/min (ref >60)
Glucose, Bld: 143 mg/dL — ABNORMAL HIGH (ref 65–99)
Potassium: 4.8 mmol/L (ref 3.5–5.1)
SODIUM: 132 mmol/L — AB (ref 135–145)
Total Bilirubin: 0.5 mg/dL (ref 0.3–1.2)
Total Protein: 5.2 g/dL — ABNORMAL LOW (ref 6.5–8.1)

## 2017-06-24 LAB — GLUCOSE, CAPILLARY
GLUCOSE-CAPILLARY: 204 mg/dL — AB (ref 65–99)
GLUCOSE-CAPILLARY: 159 mg/dL — AB (ref 65–99)
Glucose-Capillary: 155 mg/dL — ABNORMAL HIGH (ref 65–99)
Glucose-Capillary: 180 mg/dL — ABNORMAL HIGH (ref 65–99)
Glucose-Capillary: 144 mg/dL — ABNORMAL HIGH (ref 65–99)
Glucose-Capillary: 160 mg/dL — ABNORMAL HIGH (ref 65–99)

## 2017-06-24 LAB — HEMOGLOBIN A1C
HEMOGLOBIN A1C: 6.9 % — AB (ref 4.8–5.6)
MEAN PLASMA GLUCOSE: 151 mg/dL

## 2017-06-24 LAB — HIV ANTIBODY (ROUTINE TESTING W REFLEX): HIV SCREEN 4TH GENERATION: NONREACTIVE

## 2017-06-24 LAB — SURGICAL PCR SCREEN
MRSA, PCR: NEGATIVE
Staphylococcus aureus: NEGATIVE

## 2017-06-24 LAB — PATHOLOGIST SMEAR REVIEW

## 2017-06-24 LAB — PROTIME-INR
INR: 1.23
PROTHROMBIN TIME: 15.6 s — AB (ref 11.4–15.2)

## 2017-06-24 LAB — TROPONIN I: Troponin I: 0.04 ng/mL (ref <0.03)

## 2017-06-24 SURGERY — COLON RESECTION LAPAROSCOPIC
Anesthesia: General | Site: Abdomen

## 2017-06-24 MED ORDER — HYDROMORPHONE HCL 1 MG/ML IJ SOLN: 0.2500 mg | INTRAMUSCULAR | Status: DC | PRN

## 2017-06-24 MED ORDER — OXYCODONE HCL 5 MG PO TABS: 5.0000 mg | ORAL_TABLET | ORAL | Status: DC | PRN

## 2017-06-24 MED ORDER — DEXTROSE 5 % IV SOLN
3.0000 g | INTRAVENOUS | Status: AC
  Administered 2017-06-24: 3 g via INTRAVENOUS
  Filled 2017-06-24 (×2): qty 3000

## 2017-06-24 MED ORDER — SUGAMMADEX SODIUM 500 MG/5ML IV SOLN
INTRAVENOUS | Status: AC
  Filled 2017-06-24: qty 5

## 2017-06-24 MED ORDER — PROPOFOL 10 MG/ML IV BOLUS
INTRAVENOUS | Status: AC
  Filled 2017-06-24: qty 20

## 2017-06-24 MED ORDER — PROPOFOL 10 MG/ML IV BOLUS
INTRAVENOUS | Status: DC | PRN
  Administered 2017-06-24: 100 mg via INTRAVENOUS
  Administered 2017-06-24: 200 mg via INTRAVENOUS

## 2017-06-24 MED ORDER — METOPROLOL TARTRATE 5 MG/5ML IV SOLN
5.0000 mg | Freq: Once | INTRAVENOUS | Status: AC
Start: 2017-06-24 — End: 2017-06-24
  Administered 2017-06-24: 5 mg via INTRAVENOUS

## 2017-06-24 MED ORDER — MAGNESIUM SULFATE 2 GM/50ML IV SOLN
2.0000 g | Freq: Once | INTRAVENOUS | Status: AC
  Administered 2017-06-24: 2 g via INTRAVENOUS
  Filled 2017-06-24: qty 50

## 2017-06-24 MED ORDER — LIDOCAINE HCL (CARDIAC) 20 MG/ML IV SOLN
INTRAVENOUS | Status: DC | PRN
  Administered 2017-06-24: 60 mg via INTRAVENOUS

## 2017-06-24 MED ORDER — BUPIVACAINE-EPINEPHRINE (PF) 0.25% -1:200000 IJ SOLN
INTRAMUSCULAR | Status: AC
  Filled 2017-06-24: qty 30

## 2017-06-24 MED ORDER — LACTATED RINGERS IV SOLN
INTRAVENOUS | Status: DC
  Administered 2017-06-24 (×2): via INTRAVENOUS

## 2017-06-24 MED ORDER — 0.9 % SODIUM CHLORIDE (POUR BTL) OPTIME
TOPICAL | Status: DC | PRN
  Administered 2017-06-24 (×2): 1000 mL

## 2017-06-24 MED ORDER — ONDANSETRON HCL 4 MG/2ML IJ SOLN
INTRAMUSCULAR | Status: AC
  Filled 2017-06-24: qty 2

## 2017-06-24 MED ORDER — PHENYLEPHRINE 40 MCG/ML (10ML) SYRINGE FOR IV PUSH (FOR BLOOD PRESSURE SUPPORT)
PREFILLED_SYRINGE | INTRAVENOUS | Status: AC
  Filled 2017-06-24: qty 20

## 2017-06-24 MED ORDER — PIPERACILLIN-TAZOBACTAM 3.375 G IVPB
3.3750 g | Freq: Three times a day (TID) | INTRAVENOUS | Status: DC
  Administered 2017-06-24 – 2017-06-27 (×9): 3.375 g via INTRAVENOUS
  Filled 2017-06-24 (×11): qty 50

## 2017-06-24 MED ORDER — METOPROLOL TARTRATE 25 MG PO TABS
25.0000 mg | ORAL_TABLET | Freq: Two times a day (BID) | ORAL | Status: DC
  Administered 2017-06-24: 25 mg via ORAL
  Filled 2017-06-24: qty 1

## 2017-06-24 MED ORDER — MEPERIDINE HCL 25 MG/ML IJ SOLN: 6.2500 mg | INTRAMUSCULAR | Status: DC | PRN

## 2017-06-24 MED ORDER — OXYCODONE HCL 5 MG PO TABS
ORAL_TABLET | ORAL | Status: AC
  Filled 2017-06-24: qty 1

## 2017-06-24 MED ORDER — OXYCODONE HCL 5 MG PO TABS
5.0000 mg | ORAL_TABLET | Freq: Once | ORAL | Status: AC | PRN
  Administered 2017-06-24: 5 mg via ORAL

## 2017-06-24 MED ORDER — ESMOLOL HCL 100 MG/10ML IV SOLN
INTRAVENOUS | Status: AC
  Filled 2017-06-24: qty 10

## 2017-06-24 MED ORDER — FENTANYL CITRATE (PF) 100 MCG/2ML IJ SOLN: 25.0000 ug | INTRAMUSCULAR | Status: DC | PRN

## 2017-06-24 MED ORDER — SUGAMMADEX SODIUM 500 MG/5ML IV SOLN
INTRAVENOUS | Status: DC | PRN
  Administered 2017-06-24: 500 mg via INTRAVENOUS

## 2017-06-24 MED ORDER — SODIUM CHLORIDE 0.9 % IR SOLN
Status: DC | PRN
  Administered 2017-06-24: 1000 mL

## 2017-06-24 MED ORDER — FENTANYL CITRATE (PF) 100 MCG/2ML IJ SOLN
INTRAMUSCULAR | Status: DC | PRN
  Administered 2017-06-24: 50 ug via INTRAVENOUS
  Administered 2017-06-24: 100 ug via INTRAVENOUS

## 2017-06-24 MED ORDER — ONDANSETRON HCL 4 MG/2ML IJ SOLN: 4.0000 mg | Freq: Four times a day (QID) | INTRAMUSCULAR | Status: DC | PRN

## 2017-06-24 MED ORDER — ALBUMIN HUMAN 5 % IV SOLN
12.5000 g | Freq: Once | INTRAVENOUS | Status: AC
  Administered 2017-06-24: 12.5 g via INTRAVENOUS

## 2017-06-24 MED ORDER — OXYCODONE HCL 5 MG/5ML PO SOLN: 5.0000 mg | Freq: Once | ORAL | Status: AC | PRN

## 2017-06-24 MED ORDER — METOPROLOL TARTRATE 5 MG/5ML IV SOLN
INTRAVENOUS | Status: AC
  Filled 2017-06-24: qty 5

## 2017-06-24 MED ORDER — DOCUSATE SODIUM 100 MG PO CAPS
100.0000 mg | ORAL_CAPSULE | Freq: Two times a day (BID) | ORAL | Status: DC
  Administered 2017-06-24 – 2017-06-27 (×6): 100 mg via ORAL
  Filled 2017-06-24 (×6): qty 1

## 2017-06-24 MED ORDER — LIDOCAINE 2% (20 MG/ML) 5 ML SYRINGE
INTRAMUSCULAR | Status: AC
  Filled 2017-06-24: qty 5

## 2017-06-24 MED ORDER — ROCURONIUM BROMIDE 100 MG/10ML IV SOLN
INTRAVENOUS | Status: DC | PRN
  Administered 2017-06-24: 10 mg via INTRAVENOUS
  Administered 2017-06-24: 50 mg via INTRAVENOUS

## 2017-06-24 MED ORDER — FENTANYL CITRATE (PF) 250 MCG/5ML IJ SOLN
INTRAMUSCULAR | Status: AC
  Filled 2017-06-24: qty 5

## 2017-06-24 MED ORDER — ALBUMIN HUMAN 5 % IV SOLN
INTRAVENOUS | Status: DC | PRN
  Administered 2017-06-24: 12:00:00 via INTRAVENOUS

## 2017-06-24 MED ORDER — METOPROLOL TARTRATE 5 MG/5ML IV SOLN: 2.5000 mg | INTRAVENOUS | Status: DC | PRN

## 2017-06-24 MED ORDER — ROCURONIUM BROMIDE 10 MG/ML (PF) SYRINGE
PREFILLED_SYRINGE | INTRAVENOUS | Status: AC
  Filled 2017-06-24: qty 5

## 2017-06-24 MED ORDER — OXYCODONE HCL 5 MG PO TABS
5.0000 mg | ORAL_TABLET | Freq: Four times a day (QID) | ORAL | Status: DC | PRN
  Administered 2017-06-25 – 2017-06-27 (×8): 5 mg via ORAL
  Filled 2017-06-24 (×8): qty 1

## 2017-06-24 MED ORDER — METOPROLOL TARTRATE 5 MG/5ML IV SOLN
INTRAVENOUS | Status: AC
  Administered 2017-06-24: 5 mg via INTRAVENOUS
  Filled 2017-06-24: qty 5

## 2017-06-24 MED ORDER — PHENYLEPHRINE HCL 10 MG/ML IJ SOLN
INTRAMUSCULAR | Status: DC | PRN
  Administered 2017-06-24 (×3): 80 ug via INTRAVENOUS

## 2017-06-24 MED ORDER — ONDANSETRON HCL 4 MG/2ML IJ SOLN
INTRAMUSCULAR | Status: DC | PRN
  Administered 2017-06-24: 4 mg via INTRAVENOUS

## 2017-06-24 MED ORDER — LORAZEPAM 2 MG/ML IJ SOLN
0.5000 mg | Freq: Once | INTRAMUSCULAR | Status: AC
  Administered 2017-06-24: 0.5 mg via INTRAVENOUS
  Filled 2017-06-24: qty 1

## 2017-06-24 MED ORDER — METOPROLOL TARTRATE 5 MG/5ML IV SOLN
INTRAVENOUS | Status: DC | PRN
  Administered 2017-06-24: 1 mg via INTRAVENOUS
  Administered 2017-06-24 (×2): 2 mg via INTRAVENOUS

## 2017-06-24 MED ORDER — ESMOLOL HCL 100 MG/10ML IV SOLN
INTRAVENOUS | Status: DC | PRN
  Administered 2017-06-24 (×2): 20 mg via INTRAVENOUS

## 2017-06-24 MED ORDER — SODIUM CHLORIDE 0.9 % IV BOLUS (SEPSIS)
1000.0000 mL | Freq: Once | INTRAVENOUS | Status: AC
Start: 2017-06-24 — End: 2017-06-24
  Administered 2017-06-24: 1000 mL via INTRAVENOUS

## 2017-06-24 MED ORDER — HYDROMORPHONE HCL 1 MG/ML IJ SOLN
INTRAMUSCULAR | Status: DC
Start: 2017-06-24 — End: 2017-06-24
  Filled 2017-06-24: qty 1

## 2017-06-24 MED ORDER — ONDANSETRON HCL 4 MG/2ML IJ SOLN: 4.0000 mg | Freq: Once | INTRAMUSCULAR | Status: DC | PRN

## 2017-06-24 MED ORDER — BUPIVACAINE-EPINEPHRINE 0.25% -1:200000 IJ SOLN
INTRAMUSCULAR | Status: DC | PRN
  Administered 2017-06-24: 10 mL

## 2017-06-24 MED ORDER — ALBUMIN HUMAN 5 % IV SOLN
INTRAVENOUS | Status: AC
  Administered 2017-06-24: 12.5 g via INTRAVENOUS
  Filled 2017-06-24: qty 250

## 2017-06-24 SURGICAL SUPPLY — 53 items
APPLIER CLIP 5 13 M/L LIGAMAX5 (MISCELLANEOUS) ×4
BLADE CLIPPER SURG (BLADE) ×4 IMPLANT
CANISTER SUCT 3000ML PPV (MISCELLANEOUS) ×4 IMPLANT
CHLORAPREP W/TINT 26ML (MISCELLANEOUS) ×4 IMPLANT
CLIP APPLIE 5 13 M/L LIGAMAX5 (MISCELLANEOUS) ×2 IMPLANT
COVER SURGICAL LIGHT HANDLE (MISCELLANEOUS) ×4 IMPLANT
ELECT BLADE 6.5 EXT (BLADE) ×4 IMPLANT
ELECT CAUTERY BLADE 6.4 (BLADE) ×4 IMPLANT
ELECT REM PT RETURN 9FT ADLT (ELECTROSURGICAL) ×4
ELECTRODE REM PT RTRN 9FT ADLT (ELECTROSURGICAL) ×2 IMPLANT
GLOVE BIO SURGEON STRL SZ 6.5 (GLOVE) ×3 IMPLANT
GLOVE BIO SURGEONS STRL SZ 6.5 (GLOVE) ×1
GLOVE BIOGEL PI IND STRL 6.5 (GLOVE) ×4 IMPLANT
GLOVE BIOGEL PI IND STRL 7.0 (GLOVE) ×2 IMPLANT
GLOVE BIOGEL PI IND STRL 8 (GLOVE) ×2 IMPLANT
GLOVE BIOGEL PI INDICATOR 6.5 (GLOVE) ×4
GLOVE BIOGEL PI INDICATOR 7.0 (GLOVE) ×2
GLOVE BIOGEL PI INDICATOR 8 (GLOVE) ×2
GLOVE SURG SS PI 6.0 STRL IVOR (GLOVE) ×4 IMPLANT
GLOVE SURG SS PI 7.5 STRL IVOR (GLOVE) ×4 IMPLANT
GOWN STRL REUS W/ TWL LRG LVL3 (GOWN DISPOSABLE) ×4 IMPLANT
GOWN STRL REUS W/ TWL XL LVL3 (GOWN DISPOSABLE) ×2 IMPLANT
GOWN STRL REUS W/TWL LRG LVL3 (GOWN DISPOSABLE) ×4
GOWN STRL REUS W/TWL XL LVL3 (GOWN DISPOSABLE) ×2
HANDLE SUCTION POOLE (INSTRUMENTS) ×2 IMPLANT
KIT BASIN OR (CUSTOM PROCEDURE TRAY) ×4 IMPLANT
KIT OSTOMY DRAINABLE 2.75 STR (WOUND CARE) ×4 IMPLANT
KIT ROOM TURNOVER OR (KITS) ×4 IMPLANT
LIGASURE IMPACT 36 18CM CVD LR (INSTRUMENTS) ×4 IMPLANT
NS IRRIG 1000ML POUR BTL (IV SOLUTION) ×8 IMPLANT
PAD ARMBOARD 7.5X6 YLW CONV (MISCELLANEOUS) ×4 IMPLANT
PENCIL BUTTON HOLSTER BLD 10FT (ELECTRODE) ×4 IMPLANT
RETRACTOR WND ALEXIS 25 LRG (MISCELLANEOUS) ×2 IMPLANT
RTRCTR WOUND ALEXIS 25CM LRG (MISCELLANEOUS) ×4
SCISSORS LAP 5X35 DISP (ENDOMECHANICALS) ×4 IMPLANT
SET IRRIG TUBING LAPAROSCOPIC (IRRIGATION / IRRIGATOR) ×4 IMPLANT
SLEEVE ENDOPATH XCEL 5M (ENDOMECHANICALS) ×12 IMPLANT
SPONGE LAP 18X18 X RAY DECT (DISPOSABLE) ×4 IMPLANT
STAPLER PROXIMATE 75MM BLUE (STAPLE) ×4 IMPLANT
STAPLER VISISTAT 35W (STAPLE) ×4 IMPLANT
SUCTION POOLE HANDLE (INSTRUMENTS) ×4
SUT PDS AB 1 TP1 96 (SUTURE) ×8 IMPLANT
SUT PDS AB 2-0 CT1 27 (SUTURE) ×4 IMPLANT
SUT SILK 2 0 SH CR/8 (SUTURE) ×4 IMPLANT
SUT SILK 3 0 SH CR/8 (SUTURE) ×4 IMPLANT
SUT VIC AB 3-0 SH 18 (SUTURE) ×4 IMPLANT
TOWEL OR 17X26 10 PK STRL BLUE (TOWEL DISPOSABLE) ×4 IMPLANT
TRAY LAPAROSCOPIC MC (CUSTOM PROCEDURE TRAY) ×4 IMPLANT
TROCAR XCEL NON-BLD 5MMX100MML (ENDOMECHANICALS) ×4 IMPLANT
TUBE CONNECTING 12'X1/4 (SUCTIONS) ×1
TUBE CONNECTING 12X1/4 (SUCTIONS) ×3 IMPLANT
TUBING INSUF HEATED (TUBING) ×4 IMPLANT
YANKAUER SUCT BULB TIP NO VENT (SUCTIONS) ×4 IMPLANT

## 2017-06-24 NOTE — Progress Notes (Signed)
CRITICAL VALUE ALERT  Critical Value:  Troponin 0.04  Date & Time Notified:  06/24/17, 1653  Provider Notified: Dr. Herbie BaltimoreHarding, Cardiology  Orders Received/Actions taken: MD at bedside, will follow

## 2017-06-24 NOTE — Progress Notes (Signed)
Pharmacy Antibiotic Note  Alex Abbott is a 66 y.o. male admitted on 06/23/2017 with wound infection.  Pharmacy has been consulted for zosyn dosing.  Plan: 1. Zosyn 3.375g IV q 8 hrs. 2. F/u cultures, renal function and clinical course.  Height: 6' (182.9 cm) Weight: 280 lb 6.8 oz (127.2 kg) IBW/kg (Calculated) : 77.6  Temp (24hrs), Avg:98.4 F (36.9 C), Min:98 F (36.7 C), Max:98.7 F (37.1 C)   Recent Labs Lab 06/17/17 1820 06/19/17 0635 06/22/17 0715 06/23/17 1059 06/24/17 0353  WBC 12.2*  --  8.9 13.0* 13.2*  CREATININE 1.25* 1.08 1.09 1.11 0.98    Estimated Creatinine Clearance: 106.3 mL/min (by C-G formula based on SCr of 0.98 mg/dL).    Allergies  Allergen Reactions  . Propafenone Other (See Comments)    Weak     Antimicrobials this admission: Cefazolin x 1 7/27 >>  Zosyn 7/27 >>   Dose adjustments this admission:   Microbiology results:  7/27 BCx:   UCx:    Sputum:   7/26  MRSA PCR: Neg  Thank you for allowing pharmacy to be a part of this patient's care.  Tad MooreJessica Jakob Kimberlin, Pharm D, BCPS  Clinical Pharmacist Pager [phone]  06/24/2017 5:12 PM

## 2017-06-24 NOTE — Progress Notes (Signed)
Spoke with Luster Landsbergenee with IV team regarding radiology report.  Per Renee PICC is not in the correct place and needs to be removed.  MD notified.

## 2017-06-24 NOTE — Consult Note (Signed)
Cardiology Consultation:   Patient ID: Alex GuysCharles L Arcia; [id number]; 1951/01/24   Admit date: 06/23/2017 Date of Consult: 06/24/2017  Primary Care Provider: Conni ElliotPaul R Eason [facility] [place], [place] Primary Cardiologist: None  Primary Electrophysiologist:  none   Patient Profile:   Alex Abbott is a 66 y.o. male with a hx of atrial fib x 30 years or more, never anticoagulated, DM, HLD, HTN, morbid obesity, CKD III, possible CAD (told by PCP but never had + testing), bipolar d/o and chronic pain issues, who is being seen today for the evaluation of rapid atrial fib at the request of Dr Heide SparkNarendra.  History of Present Illness:   Mr. Gilford RileHand [facility] possible diverting colostomy because of fecal incontinence contributing to poor wound healing of his Fournier's gangrene of the buttocks.   Mr Macbride had Laparoscopic-assisted creation of end sigmoid colostomy today and tolerated the procedure well.    During and post-procedure, his HR was elevated and cardiology was asked to evaluate him.   Mr Every is not aware of his HR except when it is fast. He has had syncope in the past, none in the last 4-5 years. PTA, he rarely was aware of his HR, does not think it was going fast very often. He does not routinely get presyncope.  He has had chest pain, last time 3-4 days ago. It was minor, not exertional. Had a stress test decades ago, it was ok. He has chronic DOE, feels respirations are at baseline. He denies LE edema.   Currently he does not feel well due to 2 doses of Miralax yesterday, the surgery and has not eaten since yesterday.   Past Medical History:  Diagnosis Date  . Atrial fibrillation (HCC)   . Buttock wound 05/2017  . Diabetes mellitus without complication (HCC)   . Gangrene (HCC)    Fournier's gangrene  . HTN (hypertension)   . Permanent atrial fibrillation (HCC) 06/24/2017  . Septic shock (HCC) 05/2017    Past Surgical History:    Procedure Laterality Date  . IRRIGATION AND DEBRIDEMENT BUTTOCKS    . TESTICLE SURGERY    . TONSILLECTOMY       Inpatient Medications: Scheduled Meds: . [MAR Hold] aspirin  81 mg Oral Daily  . [MAR Hold] atorvastatin  40 mg Oral Daily  . [MAR Hold] docusate sodium  100 mg Oral Daily  . [MAR Hold] famotidine  20 mg Oral BID  . [MAR Hold] FLUoxetine  20 mg Oral BID  . [MAR Hold] heparin  5,000 Units Subcutaneous Q8H  . [MAR Hold] insulin aspart  0-20 Units Subcutaneous TID WC  . [MAR Hold] insulin aspart  0-5 Units Subcutaneous QHS  . metoprolol tartrate  25 mg Oral BID  . oxyCODONE      . [MAR Hold] vitamin C  500 mg Oral BID  . [MAR Hold] zinc sulfate  220 mg Oral Daily   Continuous Infusions: . lactated ringers 10 mL/hr at 06/24/17 0956   PRN Meds: [MAR Hold] acetaminophen **OR** [MAR Hold] acetaminophen, [MAR Hold] ALPRAZolam, HYDROmorphone (DILAUDID) injection, [MAR Hold]  morphine injection, ondansetron (ZOFRAN) IV, [MAR Hold] oxyCODONE, oxyCODONE, [MAR Hold] polyethylene glycol Prior to Admission medications   Medication Sig  acetaminophen (TYLENOL) 325 MG tablet Take 650 mg by mouth every 6 (six) hours as needed for mild pain.  ALPRAZolam (XANAX) 0.5 MG tablet Take 0.5 mg by mouth 3 (three) times daily as needed for anxiety.  Amino  Acids-Protein Hydrolys (PRO-STAT PO) Take 30 mLs by mouth 2 (two) times daily.  aspirin 325 MG tablet Take 325 mg by mouth daily.  atorvastatin (LIPITOR) 10 MG tablet Take 20 mg by mouth daily.   docusate sodium (COLACE) 100 MG capsule Take 100 mg by mouth daily.  famotidine (PEPCID) 20 MG tablet Take 20 mg by mouth 2 (two) times daily.  FLUoxetine (PROZAC) 20 MG capsule Take 20 mg by mouth 2 (two) times daily.  glucagon (GLUCAGON EMERGENCY) 1 MG injection Inject 1 mg into the vein once as needed.  insulin glargine (LANTUS) 100 UNIT/ML injection Inject 20 Units into the skin 2 (two) times daily.  insulin lispro (HUMALOG) 100 UNIT/ML injection  Inject 0-16 Units into the skin See admin instructions. Per sliding scale for BS 70-150=0 units, 151-200=4 units, 201-250=8 units, 251-300=10 units, 301-350=12 units, 351-400=16 units, BG>400 give 16 units and call MD  magnesium oxide (MAG-OX) 400 MG tablet Take 400 mg by mouth every 6 (six) hours as needed.  metoprolol tartrate (LOPRESSOR) 25 MG tablet Take 12.5 mg by mouth 4 (four) times daily.  oxycodone (OXY-IR) 5 MG capsule Take 5 mg by mouth every 6 (six) hours as needed for pain.  polyethylene glycol (MIRALAX / GLYCOLAX) packet Take 17 g by mouth 2 (two) times daily.  potassium chloride SA (K-DUR,KLOR-CON) 20 MEQ tablet Take 40 mEq by mouth every 4 (four) hours as needed (supplement).  sodium polystyrene (KAYEXALATE) 15 GM/60ML suspension Take 30 g by mouth as needed.  vitamin C (ASCORBIC ACID) 500 MG tablet Take 500 mg by mouth 2 (two) times daily.  zinc sulfate 220 (50 Zn) MG capsule Take 220 mg by mouth daily.    Allergies:    Allergies  Allergen Reactions  . Propafenone Other (See Comments)    Weak    Social History:   Social History   Social History  . Marital status: Married    Spouse name: N/A  . Number of children: N/A  . Years of education: N/A   Occupational History  . Retired    Social History Main Topics  . Smoking status: Former Smoker    Quit date: 05/10/2017  . Smokeless tobacco: Never Used  . Alcohol use No     Comment: RARE  . Drug use: No  . Sexual activity: Not on file   Other Topics Concern  . Not on file   Social History Narrative   Pt lives alone in [place], [place]    Family History:   The patient's family history includes Alcohol abuse (age of onset: 71) in his father; Valvular heart disease (age of onset: 77) in his mother. There is no history of CAD. Pt indicated that his mother is deceased. He indicated that his father is deceased. He indicated that his paternal grandmother is deceased. He indicated that his paternal grandfather is  deceased. He indicated that the status of his neg hx is unknown.    ROS:  Please see the history of present illness.  All other ROS reviewed and negative.      Physical Exam/Data:   Vitals:   06/24/17 1555 06/24/17 1610 06/24/17 1625 06/24/17 1640  BP: (!) 90/56 (!) 90/55 103/66 113/72  Pulse: (!) 116 (!) 114 (!) 110 (!) 103  Resp: (!) 22 (!) 21 19 20   Temp:      TempSrc:      SpO2: 96% 99% 99% 97%  Weight:      Height:  Intake/Output Summary (Last 24 hours) at 06/24/17 1658 Last data filed at 06/24/17 1345  Gross per 24 hour  Intake             1350 ml  Output             1450 ml  Net             -100 ml   Filed Weights   06/23/17 1016 06/23/17 2051 06/24/17 0944  Weight: 280 lb (127 kg) 280 lb 6.8 oz (127.2 kg) 280 lb 6.8 oz (127.2 kg)   Body mass index is 38.03 kg/m.  General:  Well nourished, well developed, in moderate pain. HEENT: normal Lymph: no adenopathy Neck: no JVD seen, difficult to assess 2nd body habitus Endocrine:  No thryomegaly Vascular: No carotid bruits; FA pulses 1-2+ bilaterally without bruits  Cardiac:  normal S1, S2; Irreg R&R; no murmur  Lungs:  Decreased BS bases bilaterally, no wheezing, rhonchi or rales  Abd: soft, + tender, colostomy in place Ext: trace-1+ edema Musculoskeletal:  No deformities, BUE and BLE strength normal and equal Skin: warm and dry  Neuro:  CNs 2-12 intact, no focal abnormalities noted Psych:  Normal affect   EKG:  The EKG was personally reviewed and demonstrates:  Atrial fib, HR 120, RBBB of unknown chronicity  Telemetry:  Telemetry was personally reviewed and demonstrates:  Atrial fib, RVR  Relevant CV Studies: ECHO: 06/08/2017 SUMMARY The left ventricular size is normal. Left ventricular systolic function is normal. LV ejection fraction = 60-65%. The left ventricular wall motion is normal. The right ventricle is not well visualized. Right ventricular function cannot be assessed due to poor image  quality. There is no significant valvular stenosis or regurgitation There was insufficient TR detected to calculate RV systolic pressure. IVC size was mildly dilated. There is no pericardial effusion. Bubble study indeterminate due to inadequate IV access. There is no comparison study available.  Laboratory Data:  Chemistry  Recent Labs Lab 06/22/17 0715 06/23/17 1059 06/24/17 0353  NA 133* 132* 132*  K 4.7 4.8 4.8  CL 97* 97* 98*  CO2 29 26 27   GLUCOSE 151* 139* 143*  BUN 21* 25* 21*  CREATININE 1.09 1.11 0.98  CALCIUM 8.4* 8.5* 8.1*  GFRNONAA >60 >60 >60  GFRAA >60 >60 >60  ANIONGAP 7 9 7      Recent Labs Lab 06/17/17 1820 06/24/17 0353  PROT 5.3* 5.2*  ALBUMIN 1.4* 1.2*  AST 25 28  ALT 23 37  ALKPHOS 120 183*  BILITOT 0.7 0.5   Hematology  Recent Labs Lab 06/22/17 0715 06/23/17 1059 06/24/17 0353  WBC 8.9 13.0* 13.2*  RBC 2.71* 3.11* 2.94*  HGB 8.1* 9.2* 8.3*  HCT 25.8* 29.2* 27.4*  MCV 95.2 93.9 93.2  MCH 29.9 29.6 28.2  MCHC 31.4 31.5 30.3  RDW 16.3* 15.9* 16.0*  PLT 412* 413* 466*    Radiology/Studies:  Dg Chest Portable 1 View Result Date: 06/23/2017 CLINICAL DATA:  Status post PICC line placement on the right. EXAM: PORTABLE CHEST 1 VIEW COMPARISON:  None in PACs FINDINGS: The PICC line tip terminates at the junction of the right and left brachiocephalic veins. The lungs are adequately inflated. There is no pneumothorax. The retrocardiac region on the left is dense. The cardiac silhouette is enlarged. The pulmonary vascularity is mildly prominent centrally. IMPRESSION: No postprocedure complication following right-sided PICC line placement. The tip of the catheter however terminates at the level of the junction of the right and left  brachiocephalic veins. Cardiomegaly with central pulmonary vascular prominence may reflect mild CHF. Electronically Signed   By: Eileen Croswell  [place] M.D.   On: 06/23/2017 11:15    Assessment and Plan:   1.  Permanent /  Persistant atrial fibrillation with RVR (HCC) - pt is on his net home dose of BB, Lopressor 12.5 mg qid>>25 mg bid - agree with this but will add prn IV Lopressor to use when HR is > 120 if SBP will tolerate - with recent echo, no further eval needed - he probably has diastolic dysfunction, watch volume. Will order daily weights.  Otherwise, per IM  Principal Problem:   Fournier's gangrene Active Problems:   Buttock wound   Pressure injury of skin   Signed, Theodore DemarkBarrett, Rhonda, PA-C  06/24/2017 4:58 PM  I have seen, examined and evaluated the patient this Pm along with Theodore Demarkhonda Barrett, PA-C.  After reviewing all the available data and chart, we discussed the patients laboratory, study & physical findings as well as symptoms in detail. I agree with her findings, examination as well as impression recommendations as per our discussion.     Mr. Lindenberger has a known history of atrial fibrillation that he usually sensitive to, heart rate is not noticing it. He is postop reoperation for Fournier's gangrene and is now in A. fib RVR. He was in A. fib the day leading into surgery, and now not unexpectedly postoperatively he is somewhat tachycardic. I had a long discussion with the resident team, and I agreed the best treatment option for him currently is just pretty consistent rate control with beta blocker. With the knowledge (however, that his atrial fibrillation rate will be slightly higher in what would expect his sinus tachycardia rate to be given his postop condition. Beta adrenergic blockade is the best treatment in the setting of postoperative pain, but we don't want to treat acompensatory response to aggressively.  He slowed down some with the IV dose of Lopressor - agree with increasing dose of metoprolol 25, and to consider increasing to 25 3 -4 times a day if necessary to ensure continued coverage.  His echocardiogram did not suggest significant diastolic dysfunction, has normal function of  therefore he could tolerate volume resuscitation, but would be careful to over hydrate, because of the lack of atrial kick leading to exacerbation of potential diastolic dysfunction.  As he is postop, and has had chronic intermittent persistent atrial fibrillation, he has not been on anticoagulation and will think we could do so right now. As such, I would try to avoid rhythm control agents such as amiodarone. However if he became continues to be hypotensive and tachycardic, that would be another option.  Cardiology will follow along & assist as needed, but the primary team seems to have things under control.   Bryan Lemmaavid Louine Tenpenny, M.D., M.S. Interventional Cardiologist   Pager # [phone] Phone # [phone] [address] [place], [place] [address]

## 2017-06-24 NOTE — Progress Notes (Signed)
  Date: 06/24/2017  Patient name: Alex Abbott  Medical record number: [id number]  Date of birth: [birth year]   I have seen and evaluated Alex Abbott L Snedeker and discussed their care with the Residency Team. In brief, patient is a 66 year old male with past medical history of poorly controlled type 2 diabetes, atrial fibrillation not on anticoagulation, chronic pain, recent diagnosis of Fournier's gangrene status post [facility], CKD who presented with nonhealing wounds from the long-term acute care facility [facility].  [facility] for his nonhealing wound status post multiple debridements for possible diverticular colostomy. The concern at his long-term acute care facility was that his wounds or nonhealing secondary to rectal incontinence. Yesterday patient also complained of some myalgias and general malaise. No chest pain, no shortness of breath, no fevers or chills, no lightheadedness, no syncope, no focal weakness, no nausea or vomiting, no diarrhea, no abdominal pain. Patient states his pain is well-controlled today but does complain of anxiety as he has not received his alprazolam this morning.  PMHx, Fam Hx, and/or Soc Hx : as per resident admit note  Vitals:   06/24/17 0605 06/24/17 0937  BP: 103/68 (!) 110/54  Pulse: 100 (!) 119  Resp: 20 18  Temp: 98.3 F (36.8 C) 98 F (36.7 C)   Gen.: Awake alert and oriented 3, NAD CVS: irregularly irregular, mildly tachycardic Lungs: CTA bilaterally Abdomen: Soft, nontender, obese, normoactive bowel sounds. Patient did not permit examination of his wounds at this time Extremities: Trace bilateral lower extremity edema noted  Assessment and Plan: I have seen and evaluated the patient as outlined above. I agree with the formulated Assessment and Plan as detailed in the residents' note, with the following changes:   1.Extensive nonhealing wounds status post multiple debridements for  Fournier's gangrene: - Patient was being cared for at a long-term acute care facility but was noted to have no improvement in his wounds despite aggressive treatment. It was suspected that his wounds were not healing secondary to rectal incontinence and fecal contamination of the wounds. He was noted to have a mild leukocytosis on admission but no fevers. He was noted to have some purulent drainage per surgery.  - Patient scheduled for a diverting colostomy today - We'll maintain suprapubic catheter in place for now - Wound care consult appreciated. Continue with dressings per wound care. - Surgery follow-up and recommendations appreciated. - We'll discuss with surgery as to utility of antibiotics for his wound. - Patient will need PT/OT eval - We'll get nutrition consult given his hypoalbuminemia and poor wound healing - No further workup for now  Earl LagosNarendra, Milessa Hogan, MD 7/27/201812:40 PM

## 2017-06-24 NOTE — Progress Notes (Signed)
Initial Nutrition Assessment  DOCUMENTATION CODES:   Obesity unspecified  INTERVENTION:   -RD will follow for diet advancement and supplement as appropriate  NUTRITION DIAGNOSIS:   Increased nutrient needs related to wound healing as evidenced by estimated needs.  GOAL:   Patient will meet greater than or equal to 90% of their needs  MONITOR:   Diet advancement, Labs, Weight trends, Skin, I & O's  REASON FOR ASSESSMENT:   Consult Wound healing  ASSESSMENT:   Patient is a 66 year old male with past medical history of poorly controlled type 2 diabetes, atrial fibrillation not on anticoagulation, chronic pain, recent diagnosis of Fournier's gangrene status post multiple debridements at [facility], CKD who presented with nonhealing wounds  Pt down in OR for diverting colostomy at time of visit. Unable to obtain further nutrition hx or completed nutrition-focused physical exam at time of visit.   Case discussed with RN, who confirms plan for diverting colostomy. Pt was admitted from [facility] and was utilizing a specialty mattress there. Pt with unstageable pressure injuries to sacrum and buttocks. Pt recent underwent I&D [facility] perineum at [facility] on 06/06/17.   [facility]. Noted wt of 295# in 05/2016; weight changes not significant for time frame.   Medications reviewed and include vitamin C and zinc sulfate.   Labs reviewed: Na: 132, CBGS: 155-180.   Diet Order:  Diet NPO time specified  Skin:  Wound (see comment) (UN sacrum and buttocks, closed abdominal incision)  Last BM:  06/23/17  Height:   Ht Readings from Last 1 Encounters:  06/24/17 6' (1.829 m)    Weight:   Wt Readings from Last 1 Encounters:  06/24/17 280 lb 6.8 oz (127.2 kg)    Ideal Body Weight:  80.9 kg  BMI:  Body mass index is 38.03 kg/m.  Estimated Nutritional Needs:   Kcal:  2400-2600  Protein:  140-160 grams  Fluid:  > 2.4 L  EDUCATION NEEDS:    Education needs no appropriate at this time  Tobin Witucki A. Mayford KnifeWilliams, RD, LDN, CDE Pager: [phone] After hours Pager: [phone]

## 2017-06-24 NOTE — Transfer of Care (Signed)
Immediate Anesthesia Transfer of Care Note  Patient: Alex Abbott  Procedure(s) Performed: Procedure(s): LAPAROSCOPIC ASSISSTED OSTOMY (N/A) COLOSTOMY (Left)  Patient Location: PACU  Anesthesia Type:General  Level of Consciousness: awake and alert   Airway & Oxygen Therapy: Patient Spontanous Breathing and Patient connected to nasal cannula oxygen  Post-op Assessment: Report given to RN and Post -op Vital signs reviewed and stable  Post vital signs: Reviewed and stable  Last Vitals:  Vitals:   06/24/17 0605 06/24/17 0937  BP: 103/68 (!) 110/54  Pulse: 100 (!) 119  Resp: 20 18  Temp: 36.8 C 36.7 C    Last Pain:  Vitals:   06/24/17 0937  TempSrc: Oral  PainSc:          Complications: No apparent anesthesia complications

## 2017-06-24 NOTE — Anesthesia Procedure Notes (Signed)
Central Venous Catheter Insertion Performed by: Chaney MallingHODIERNE, Carrel Leather, anesthesiologist Start/End7/27/2018 5:40 PM, 06/24/2017 5:50 PM Patient location: Pre-op. Preanesthetic checklist: patient identified, IV checked, site marked, risks and benefits discussed, surgical consent, monitors and equipment checked, pre-op evaluation, timeout performed and anesthesia consent Lidocaine 1% used for infiltration and patient sedated Lozon hygiene performed  and maximum sterile barriers used  Catheter size: 8 Fr Total catheter length 16. Central line was placed.Double lumen Procedure performed using ultrasound guided technique. Ultrasound Notes:image(s) printed for medical record Attempts: 1 Following insertion, dressing applied and line sutured. Post procedure assessment: blood return through all ports  Patient tolerated the procedure well with no immediate complications.

## 2017-06-24 NOTE — Anesthesia Procedure Notes (Signed)
Procedure Name: Intubation Date/Time: 06/24/2017 11:32 AM Performed by: Manuela Schwartz B Pre-anesthesia Checklist: Patient identified, Emergency Drugs available, Suction available, Patient being monitored and Timeout performed Patient Re-evaluated:Patient Re-evaluated prior to induction Oxygen Delivery Method: Circle system utilized Preoxygenation: Pre-oxygenation with 100% oxygen Induction Type: IV induction Ventilation: Mask ventilation with difficulty and Oral airway inserted - appropriate to patient size Laryngoscope Size: Mac and 3 Grade View: Grade I Tube type: Oral Tube size: 7.5 mm Number of attempts: 1 Airway Equipment and Method: Stylet Placement Confirmation: ETT inserted through vocal cords under direct vision,  positive ETCO2 and breath sounds checked- equal and bilateral Secured at: 23 cm Tube secured with: Tape Dental Injury: Teeth and Oropharynx as per pre-operative assessment

## 2017-06-24 NOTE — Anesthesia Postprocedure Evaluation (Signed)
Anesthesia Post Note  Patient: Alex Abbott  Procedure(s) Performed: Procedure(s) (LRB): LAPAROSCOPIC ASSISSTED OSTOMY (N/A) COLOSTOMY (Left)     Patient location during evaluation: PACU Anesthesia Type: General Level of consciousness: awake and alert and patient cooperative Pain management: pain level controlled Vital Signs Assessment: post-procedure vital signs reviewed and stable Respiratory status: spontaneous breathing and respiratory function stable Cardiovascular status: stable Anesthetic complications: no    Last Vitals:  Vitals:   06/24/17 1750 06/24/17 1817  BP: (!) 123/55   Pulse: (!) 105   Resp: 18   Temp: 36.6 C 36.6 C    Last Pain:  Vitals:   06/24/17 1817  TempSrc: Oral  PainSc:                  Meghen Akopyan S

## 2017-06-24 NOTE — Progress Notes (Signed)
Pt given Miralax x 2 per conversation with Zola ButtonWill Jennings, PA. Dondra SpryMoore, Odyssey Vasbinder Islee, RN

## 2017-06-24 NOTE — Progress Notes (Signed)
Report [facility].  All questions answered.

## 2017-06-24 NOTE — Progress Notes (Signed)
Patient arrived from PACU on a hospital bed, placed on tele ccmd notified, vital signs obtained, bed in lowest position , call bell within reach will continue to monitor.

## 2017-06-24 NOTE — Progress Notes (Addendum)
PT Hydrotherapy Cancellation Note  Patient Details Name: Alex Abbott MRN: [id number] DOB: 04-05-1951   Cancelled Treatment:    Reason Eval/Treat Not Completed: Patient at procedure or test/unavailable. Discussed pt case with RN this morning. Per RN pt to go to surgery this morning for further debridement as well as possible diverting colostomy. Will hold hydrotherapy evaluation this date and check on patient tomorrow.   Marylynn PearsonLaura D Zanovia Rotz 06/24/2017, 7:51 AM   Conni SlipperLaura Rickell Wiehe, PT, DPT Acute Rehabilitation Services Pager: [phone]

## 2017-06-24 NOTE — Progress Notes (Signed)
Day of Surgery   Subjective/Chief Complaint: Feels tired. Ready for surgery.   Objective: Vital signs in last 24 hours: Temp:  [98 F (36.7 C)-98.7 F (37.1 C)] 98 F (36.7 C) (07/27 0937) Pulse Rate:  [89-119] 119 (07/27 0937) Resp:  [18-22] 18 (07/27 0937) BP: (101-123)/(54-90) 110/54 (07/27 0937) SpO2:  [95 %-100 %] 95 % (07/27 0937) Weight:  [127.2 kg (280 lb 6.8 oz)] 127.2 kg (280 lb 6.8 oz) (07/27 0944) Last BM Date: 06/23/17  Intake/Output from previous day: 07/26 0701 - 07/27 0700 In: 0  Out: 1200 [Urine:1200] Intake/Output this shift: No intake/output data recorded.  Alert, cooperative Unlabored respirations Obese abdomen, nontender Perineal wound not examined Bilateral lower extremity paralysis, mild edema Lab Results:   Recent Labs  06/23/17 1059 06/24/17 0353  WBC 13.0* 13.2*  HGB 9.2* 8.3*  HCT 29.2* 27.4*  PLT 413* 466*   BMET  Recent Labs  06/23/17 1059 06/24/17 0353  NA 132* 132*  K 4.8 4.8  CL 97* 98*  CO2 26 27  GLUCOSE 139* 143*  BUN 25* 21*  CREATININE 1.11 0.98  CALCIUM 8.5* 8.1*   PT/INR  Recent Labs  06/24/17 0353  LABPROT 15.6*  INR 1.23   ABG No results for input(s): PHART, HCO3 in the last 72 hours.  Invalid input(s): PCO2, PO2  Studies/Results: Dg Chest Portable 1 View  Result Date: 06/23/2017 CLINICAL DATA:  Status post PICC line placement on the right. EXAM: PORTABLE CHEST 1 VIEW COMPARISON:  None in PACs FINDINGS: The PICC line tip terminates at the junction of the right and left brachiocephalic veins. The lungs are adequately inflated. There is no pneumothorax. The retrocardiac region on the left is dense. The cardiac silhouette is enlarged. The pulmonary vascularity is mildly prominent centrally. IMPRESSION: No postprocedure complication following right-sided PICC line placement. The tip of the catheter however terminates at the level of the junction of the right and left brachiocephalic veins. Cardiomegaly with  central pulmonary vascular prominence may reflect mild CHF. Electronically Signed   By: David  SwazilandJordan M.D.   On: 06/23/2017 11:15    Anti-infectives: Anti-infectives    Start     Dose/Rate Route Frequency Ordered Stop   06/24/17 1045  ceFAZolin (ANCEF) 3 g in dextrose 5 % 50 mL IVPB     3 g 130 mL/hr over 30 Minutes Intravenous  Once 06/24/17 1038     06/24/17 0600  cefoTEtan in Dextrose 5% (CEFOTAN) IVPB 2 g  Status:  Discontinued     2 g Intravenous On call to O.R. 06/23/17 1355 06/23/17 1705      Assessment/Plan: s/p Procedure(s): LAPAROSCOPIC ASSISSTED OSTOMY (N/A) OR today. he expressed understanding of risks of surgery including bleeding. pain, scarring, intraabdominal injury, stoma problems, heart attack, blood clot, pneumonia, stroke, death. he desires to proceed.   LOS: 1 day    Berna BueChelsea A Urho Rio 06/24/2017

## 2017-06-24 NOTE — Anesthesia Preprocedure Evaluation (Addendum)
Anesthesia Evaluation  Patient identified by MRN, date of birth, ID band Patient awake    Reviewed: Allergy & Precautions, H&P , NPO status , Patient's Chart, lab work & pertinent test results  Airway Mallampati: I   Neck ROM: full    Dental  (+) Poor Dentition, Missing, Loose, Dental Advisory Given   Pulmonary former smoker,    breath sounds clear to auscultation       Cardiovascular + dysrhythmias Atrial Fibrillation  Rhythm:regular Rate:Normal     Neuro/Psych    GI/Hepatic   Endo/Other  diabetes, Type 2, Insulin Dependent  Renal/GU      Musculoskeletal   Abdominal   Peds  Hematology  (+) Blood dyscrasia, anemia ,   Anesthesia Other Findings   Reproductive/Obstetrics                                                             Anesthesia Evaluation  Patient identified by MRN, date of birth, ID band Patient awake    Reviewed: Allergy & Precautions, H&P , NPO status , Patient's Chart, lab work & pertinent test results  Airway Mallampati: I   Neck ROM: full    Dental  (+) Poor Dentition, Missing, Loose, Dental Advisory Given   Pulmonary former smoker,    breath sounds clear to auscultation       Cardiovascular + dysrhythmias Atrial Fibrillation  Rhythm:regular Rate:Normal     Neuro/Psych    GI/Hepatic   Endo/Other  diabetes, Type 2, Insulin Dependent  Renal/GU      Musculoskeletal   Abdominal   Peds  Hematology  (+) Blood dyscrasia, anemia ,   Anesthesia Other Findings   Reproductive/Obstetrics                            Anesthesia Physical Anesthesia Plan  ASA: III  Anesthesia Plan: General   Post-op Pain Management:    Induction: Intravenous  PONV Risk Score and Plan: 4 or greater and Ondansetron, Dexamethasone, Propofol, Midazolam, Scopolamine patch - Pre-op and Treatment may vary due to age or medical  condition  Airway Management Planned: Oral ETT  Additional Equipment:   Intra-op Plan:   Post-operative Plan: Extubation in OR  Informed Consent: I have reviewed the patients History and Physical, chart, labs and discussed the procedure including the risks, benefits and alternatives for the proposed anesthesia with the patient or authorized representative who has indicated his/her understanding and acceptance.     Plan Discussed with: CRNA, Anesthesiologist and Surgeon  Anesthesia Plan Comments:         Anesthesia Quick Evaluation  Anesthesia Physical Anesthesia Plan  ASA: IV  Anesthesia Plan: General   Post-op Pain Management:    Induction: Intravenous  PONV Risk Score and Plan: 4 or greater and Ondansetron, Dexamethasone, Propofol, Midazolam and Treatment may vary due to age or medical condition  Airway Management Planned: Oral ETT  Additional Equipment:   Intra-op Plan:   Post-operative Plan: Extubation in OR  Informed Consent: I have reviewed the patients History and Physical, chart, labs and discussed the procedure including the risks, benefits and alternatives for the proposed anesthesia with the patient or authorized representative who has indicated his/her understanding and acceptance.     Plan Discussed with: CRNA, Anesthesiologist  and Surgeon  Anesthesia Plan Comments:        Anesthesia Quick Evaluation

## 2017-06-24 NOTE — Op Note (Signed)
Operative Note  Alex GuysCharles L Abbott  [id number]  [id number]  06/24/2017   Surgeon: Berna Buehelsea A Chicquita Mendel  Assistant: OR staff  Procedure performed: Laparoscopic-assisted creation of end sigmoid colostomy  Preop diagnosis: Non-healing perineal wounds and fecal incontinence in the setting of paraplegia and s/p multiple debridements of [facility] at [facility].   Post-op diagnosis/intraop findings: same  Specimens: none Retained items: none EBL: 50 cc Complications: none  Description of procedure: After obtaining informed consent the patient was taken to the operating room and placed supine on operating room table wheregeneral endotracheal anesthesia was initiated, preoperative antibiotics were administered, SCDs applied, and a formal timeout was performed. The abdomen was prepped and draped in usual sterile fashion. The previously marked site for planned colostomy was used for peritoneal access using a Visiport technique. The peritoneum was insufflated to 15 mmHg and the patient was placed in Trendelenburg and right tilt. 35 mm trochars were placed in the right hemiabdomen. The abdomen was inspected, he was noted to have mild diverticulosis and a very short vasa colon with extensive adhesions of the colon to the left lateral sidewall. These were carefully taking down sharply and with cautery where indicated ensuring hemostasis. The sigmoid and descending colon were mobilized medially to the midline. Unfortunately the patient had not received his preoperative bowel prep and his colon was happy with firm stool. I elected to create a midline vertical incision which was done sharply and then with electrocautery. An Alexis wound protector was placed, the mobilized sigmoid and descending colon were brought out into the field. About 5 cm proximal to the convergence of the tinea the colon was divided with a blue load linear cutting stapler. A 2-0 PDS suture with long tails was placed on the rectal stump  for future identification. LigaSure and electrocautery were used to partially divide the mesentery and to excise the rather large and pendulous fatty epiploic appendages from the distal sigmoid stump to further mobilize it and skeletonized sufficiently to pass through the fascial defect. Once this was complete, the heart colostomy site where had entered with a 5 mm trocar was extended by the level of the skin in the fascia. I was able to pass 2 fingers easily through the wound. The sigmoid and was then passed through the abdominal wall at this location ensuring no twist in the colon or the mesentery. There was no tension on the colon which sat easily outside abdominal wall by several centimeters. The abdomen was then inspected and hemostasis ensured. The midline incision was closed with running looped #1 PDS. The skin incisions were all closed with staples. The colostomy was then matured in standard fashion with 30 Vicryls. The mucosa was noted to be slightly dusky however appeared viable and widely patent as the patient was able to pass formed stool through the stoma before the ostomy appliance was placed. Dressings were placed all the incisions. The patient was then awakened, extubated and taken to PACU in stable condition.   All counts were correct at the completion of the case.

## 2017-06-24 NOTE — Progress Notes (Signed)
Subjective: Patient reports generally feeling OK this morning. He feels that the dysphagia he was having yesterday is improved. He is not reporting significant discomfort or pain. He is feeling anxious about surgery.  Objective:  Vital signs in last 24 hours: Vitals:   06/23/17 2051 06/24/17 0605 06/24/17 0937 06/24/17 0944  BP: 101/71 103/68 (!) 110/54   Pulse: 98 100 (!) 119   Resp: 19 20 18    Temp: 98.7 F (37.1 C) 98.3 F (36.8 C) 98 F (36.7 C)   TempSrc: Oral Oral Oral   SpO2: 100% 95% 95%   Weight: 127.2 kg (280 lb 6.8 oz)   127.2 kg (280 lb 6.8 oz)  Height:    6' (1.829 m)   Physical Exam  Constitutional: He appears well-developed and well-nourished. No distress.  HENT:  Head: Normocephalic and atraumatic.  Eyes: Pupils are equal, round, and reactive to light.  Neck: Normal range of motion.  Cardiovascular: Normal rate and normal heart sounds.   Pulmonary/Chest: Effort normal and breath sounds normal. No respiratory distress. He has no wheezes.  Abdominal: Soft. Bowel sounds are normal. He exhibits no distension. There is no tenderness.  Skin: He is not diaphoretic.  Patient declined for us to examine wounds.  Trace peripheral edema to bilateral knees.    Assessment/Plan:  Active Problems:   Buttock wound   Fournier's gangrene   Pressure injury of skin  1. Chronic non-healing Fournier gangrene: Persistent slight leukocytosis to 13.2. Also had drop of Hgb from 9.2 to 8.3, but no s/s of active bleeding from wound. Will have to monitor CBC after surgery. Patient taken to surgery this morning for diverting ostomy.  - Will advance diet after surgery   - No abx indicated at this time  - f/u repeat CBC tomm. am - Keep suprapubic cath in place for urinary diversion  - PT/OT evals to be completed after surgery  - WOCN following    2. T2dm: Repeat A1c today is 6.9. Last A1c in March was 13.8. Likely secondary to starvation and poor nutrition in setting of prolonged  recent hospitalization and being s/p multiple surgeries.  - SSI-R with HS coverage  - Hold home glyburide + metformin    3. Dysphagia: Centor score 0. Continue to monitor.   4. Hypoalbuminemia and nutrition: Persistently low albumin of 1.2. On presentation patient had 2+ peripheral edema that is now trace. No h/o CHF, [facility]18 showed normal EF estimated at 60-65%. Cardiomegaly noted on CXR. Edema is significantly improved today. Patient does endorse worsening dyspnea with activity at home prior to his recent hospitalizations. Will continue to monitor.  - nutrition following, appreciate recs   - vit C, zinc supplements  5. A-fib: CHADS-VASC = 1.  - Continue home metop 12.5 mg po q6hr. May need to increase to equivalent BID or XR dose after surgery for better nodal blockade and to improve compliance.  - Continue aspirin to 81 mg po   6. Pain control: Pain controlled with oxycodone and morphine per prior records.  - Continue oxycodone IR 5 mg q6hr PRN - Continue morphine 2 mg IV q6hr PRN   7. MDD and anxiety: Uses xanax and prozac, continuing home meds. Per prior records anxiety is a significant contributor to ongoing distress.  - Prozac 20 mg PO BID  - Xanax 0.5 mg PO TID PRN   8. ASCVD risk: 10-year ASCVD score = 22.6%. Most recent lipid profile from March 2018 was triglyceride 229, cholesterol  215, HDL 36, LDL 133. Currently on lipitor 20 mg, will increase to 40 mg.  - Continue lipitor 40 mg PO    Dispo: Anticipated discharge in >1 day(s).   Zeb ComfortSaripalli, Srinivas, Medical Student 06/24/2017, 10:35 AM Pager: [phone]  Attestation for Student Documentation:  I personally was present and performed or re-performed the history, physical exam and medical decision-making activities of this service and have verified that the service and findings are accurately documented in the student's note.  John Giovanniathore, Diamone Whistler, MD 06/24/2017, 11:30 AM

## 2017-06-25 ENCOUNTER — Encounter (HOSPITAL_COMMUNITY): Payer: Self-pay | Admitting: Surgery

## 2017-06-25 LAB — CBC
HCT: 24.8 % — ABNORMAL LOW (ref 39.0–52.0)
Hemoglobin: 7.8 g/dL — ABNORMAL LOW (ref 13.0–17.0)
MCH: 29.4 pg (ref 26.0–34.0)
MCHC: 31.5 g/dL (ref 30.0–36.0)
MCV: 93.6 fL (ref 78.0–100.0)
PLATELETS: 412 10*3/uL — AB (ref 150–400)
RBC: 2.65 MIL/uL — AB (ref 4.22–5.81)
RDW: 16.1 % — AB (ref 11.5–15.5)
WBC: 14.4 10*3/uL — ABNORMAL HIGH (ref 4.0–10.5)

## 2017-06-25 LAB — GLUCOSE, CAPILLARY
GLUCOSE-CAPILLARY: 230 mg/dL — AB (ref 65–99)
Glucose-Capillary: 159 mg/dL — ABNORMAL HIGH (ref 65–99)
Glucose-Capillary: 191 mg/dL — ABNORMAL HIGH (ref 65–99)
Glucose-Capillary: 198 mg/dL — ABNORMAL HIGH (ref 65–99)

## 2017-06-25 MED ORDER — METOPROLOL TARTRATE 50 MG PO TABS
50.0000 mg | ORAL_TABLET | Freq: Two times a day (BID) | ORAL | Status: DC
  Administered 2017-06-25 – 2017-06-27 (×4): 50 mg via ORAL
  Filled 2017-06-25 (×4): qty 1

## 2017-06-25 MED ORDER — INSULIN ASPART 100 UNIT/ML ~~LOC~~ SOLN
0.0000 [IU] | Freq: Three times a day (TID) | SUBCUTANEOUS | Status: DC
  Administered 2017-06-25: 5 [IU] via SUBCUTANEOUS
  Administered 2017-06-25 (×2): 3 [IU] via SUBCUTANEOUS
  Administered 2017-06-26: 2 [IU] via SUBCUTANEOUS
  Administered 2017-06-26 (×2): 3 [IU] via SUBCUTANEOUS
  Administered 2017-06-27: 5 [IU] via SUBCUTANEOUS
  Administered 2017-06-27: 3 [IU] via SUBCUTANEOUS
  Administered 2017-06-27: 8 [IU] via SUBCUTANEOUS

## 2017-06-25 MED ORDER — ENOXAPARIN SODIUM 40 MG/0.4ML ~~LOC~~ SOLN
40.0000 mg | SUBCUTANEOUS | Status: DC
  Administered 2017-06-25 – 2017-06-27 (×3): 40 mg via SUBCUTANEOUS
  Filled 2017-06-25 (×3): qty 0.4

## 2017-06-25 MED ORDER — METOPROLOL TARTRATE 25 MG PO TABS
25.0000 mg | ORAL_TABLET | Freq: Once | ORAL | Status: AC
  Administered 2017-06-25: 25 mg via ORAL
  Filled 2017-06-25: qty 1

## 2017-06-25 MED ORDER — METOPROLOL TARTRATE 25 MG PO TABS
25.0000 mg | ORAL_TABLET | Freq: Three times a day (TID) | ORAL | Status: DC
  Administered 2017-06-25: 25 mg via ORAL
  Filled 2017-06-25: qty 1

## 2017-06-25 MED ORDER — INSULIN GLARGINE 100 UNIT/ML ~~LOC~~ SOLN
20.0000 [IU] | Freq: Every day | SUBCUTANEOUS | Status: DC
  Administered 2017-06-25 – 2017-06-26 (×2): 20 [IU] via SUBCUTANEOUS
  Filled 2017-06-25 (×3): qty 0.2

## 2017-06-25 NOTE — Evaluation (Signed)
Occupational Therapy Evaluation Patient Details Name: Alex GuysCharles L Lose MRN: [id number] DOB: [birth year] Today's Date: 06/25/2017    History of Present Illness 66 year old male with PMH of poorly controlled type 2 diabetes, atrial fibrillation not on anticoagulation, chronic pain, recent diagnosis of Fournier's gangrene status post [facility], CKD who presented with nonhealing wounds from the long-term acute care facility [facility]. Pt is now s/p Laparoscopic-assisted creation of end sigmoid colostomy. History of spinal cord infarct, paraplegic BLE.    Clinical Impression   Pt admitted with the above diagnoses and presents with below problem list. Pt will benefit from continued acute OT to address the below listed deficits and maximize independence with basic ADLs prior to d/c. [facility] Pt is currently total A with LB ADLs, max A UB ADLs at bedlevel, setup for eating; +2 assist to roll to side per conversation with PT. Pt verbalizing feeling very discouraged at current situation. "I can't live like this anymore..I'm not talking about suicide... I just can't go on like this." Pt endorsed interest in chaplain services. Reports his goals are decreased pain and to "get to and from the bathroom."     Follow Up Recommendations  LTACH    Equipment Recommendations  Other (comment) (defer to next venue)    Recommendations for Other Services       Precautions / Restrictions Precautions Precautions: Fall      Mobility Bed Mobility                  Transfers                      Balance                                           ADL either performed or assessed with clinical judgement   ADL Overall ADL's : Needs assistance/impaired Eating/Feeding: Set up;Bed level   Grooming: Bed level;Maximal assistance   Upper Body Bathing: Maximal assistance;Bed level   Lower Body Bathing: Total assistance;Bed level    Upper Body Dressing : Maximal assistance;Bed level   Lower Body Dressing: Total assistance;Bed level                 General ADL Comments: Discussed goals of care with pt. Pt verbalizing feeling very discouraged. "I can't go on like this." Discussed importance of working on BUE strengthening.      Vision         Perception     Praxis      Pertinent Vitals/Pain Pain Assessment: Faces Faces Pain Scale: Hurts little more Pain Location: back/buttocks, abdomen Pain Intervention(s): Monitored during session     Denz Dominance     Extremity/Trunk Assessment Upper Extremity Assessment Upper Extremity Assessment: Generalized weakness   Lower Extremity Assessment Lower Extremity Assessment: Defer to PT evaluation       Communication Communication Communication: No difficulties   Cognition Arousal/Alertness: Awake/alert Behavior During Therapy: Flat affect Overall Cognitive Status: No family/caregiver present to determine baseline cognitive functioning                                     General Comments       Exercises     Shoulder Instructions  Home Living Family/patient expects to be discharged to:: Unsure Living Arrangements: Alone                               [facility]       Prior Functioning/Environment Level of Independence: Needs assistance  Gait / Transfers Assistance Needed: paraplegia relatively recently. Was previously walking with walker.  ADL's / Homemaking Assistance [facility], speaks about walking in his home with a walker prior to recent changes in health status. did not drive.             OT Problem List: Decreased strength;Decreased activity tolerance;Impaired balance (sitting and/or standing);Decreased knowledge of use of DME or AE;Decreased knowledge of precautions;Impaired tone;Obesity;Pain      OT Treatment/Interventions: Self-care/ADL  training;Therapeutic exercise;DME and/or AE instruction;Therapeutic activities;Patient/family education;Balance training    OT Goals(Current goals can be found in the care plan section) Acute Rehab OT Goals Patient Stated Goal: decreased pain and to "be able to get to the bathroom and back." OT Goal Formulation: With patient Time For Goal Achievement: 07/09/17 Potential to Achieve Goals: Fair ADL Goals Pt Will Perform Grooming: with set-up;bed level Pt Will Perform Upper Body Bathing: with max assist;bed level Pt/caregiver will Perform Home Exercise Program: Increased strength;Both right and left upper extremity;With Supervision Additional ADL Goal #1: Pt will complete rolling to side with +2 max A as a precursor to functional transfers.    OT Frequency: Min 2X/week   Barriers to D/C:            Co-evaluation              AM-PAC PT "6 Clicks" Daily Activity     Outcome Measure Help from another person eating meals?: None Help from another person taking care of personal grooming?: A Lot Help from another person toileting, which includes using toliet, bedpan, or urinal?: Total Help from another person bathing (including washing, rinsing, drying)?: Total Help from another person to put on and taking off regular upper body clothing?: A Lot Help from another person to put on and taking off regular lower body clothing?: Total 6 Click Score: 11   End of Session Nurse Communication: Other (comment) (requesting chaplain services)  Activity Tolerance:   Patient left: in bed;with call bell/phone within reach (did not adjust anything in/on bed)  OT Visit Diagnosis: Muscle weakness (generalized) (M62.81);Other abnormalities of gait and mobility (R26.89);Pain                Time: 1434-1455 OT Time Calculation (min): 21 min Charges:  OT General Charges $OT Visit: 1 Procedure OT Evaluation $OT Eval Low Complexity: 1 Procedure G-Codes:       Pilar GrammesMathews, Ileana Chalupa H 06/25/2017, 3:19  PM

## 2017-06-25 NOTE — Progress Notes (Addendum)
Subjective: Patient reports having worsening chronic mid-back pain due to poor positioning in bed/ uncomfortable mattress. States he does not have much of an appetite. No other complaints.   Objective:  Vital signs in last 24 hours: Vitals:   06/24/17 1953 06/24/17 2334 06/25/17 0427 06/25/17 0815  BP: (!) 101/58 103/61 117/65 (!) 121/103  Pulse: (!) 113 97 (!) 106 (!) 111  Resp: 15 (!) 21 20   Temp: 97.9 F (36.6 C) 98.6 F (37 C) 97.9 F (36.6 C) 98.6 F (37 C)  TempSrc: Oral Oral Oral Oral  SpO2: 99% 96% 100%   Weight:      Height:       Physical Exam  Constitutional: He appears well-developed and well-nourished.  Eating breakfast in bed Mildly distressed due to poor positioning in bed and back pain   HENT:  Head: Normocephalic and atraumatic.  Cardiovascular: Normal rate, normal heart sounds and intact distal pulses.   Pulmonary/Chest: Effort normal and breath sounds normal. No respiratory distress. He has no wheezes.  Abdominal: Soft. Bowel sounds are normal. He exhibits no distension. There is no tenderness.  A small amount of brown colored stool present in ostomy bag.   Skin: He is not diaphoretic.  Wounds not examined as patient was distressed from poor positioning in bed  +1 pitting edema in b/l LEs up to mid-shin level     Assessment/Plan:  Principal Problem:   Fournier's gangrene Active Problems:   Buttock wound   Pressure injury of skin   Permanent atrial fibrillation with RVR (HCC)  1. Chronic non-healing Fournier gangrene: S/p surgery for diverting ostomy on 7/27. Persistent slight leukocytosis to 14.4. Patient remains afebrile. Slight drop of Hgb from 8.3 yesterday to 7.8 today, likely due to surgical blood loss.  - Continue to monitor in stepdown today  - CM diet    - Continue Zosyn  - f/u blood culture drawn 7/27, still pending   - f/u repeat CBC tomm. am - Keep suprapubic cath in place for urinary diversion  - PT/OT evals pending   - WOCN  following    2. T2DM: Repeat A1c today is 6.9. Last A1c in March was 13.8. Likely secondary to starvation and poor nutrition in setting of prolonged recent hospitalization and being s/p multiple surgeries.  - Resume home Lantus at a lower dose: 20 u qhs - SSI-M - Hold home glyburide + metformin   3. Hypoalbuminemia and nutrition: Persistently low albumin of 1.2. Continue to have +1 pitting b/l LE edema up to mid-shin level. [facility] showed normal EF estimated at 60-65%. Edema likely 2/2 hypoalbuminemia.  - nutrition following, appreciate recs   - vit C, zinc supplements  4. A-fib: Rate in 100s-110s at present; BP 100s-120s/ 50s-60s. CHADS-VASC = 1.  - Increase dose of home metoprolol: 50 mg BID - IV metoprolol 2.5 mg prn HR >120 and SBP >/=110 - Cardiology following, appreciate recs  5. Pain control: Continues to complain of chronic back pain. Pain controlled with oxycodone and morphine per prior records.  - Continue oxycodone IR 5 mg q6hr PRN - Continue morphine 2 mg IV q6hr PRN   6. MDD and anxiety: Uses xanax and prozac, continuing home meds. Per prior records anxiety is a significant contributor to ongoing distress.  - Prozac 20 mg PO BID  - Xanax 0.5 mg PO TID PRN   7. ASCVD risk: 10-year ASCVD score = 22.6%. Most recent lipid profile from March 2018 was triglyceride  229, cholesterol 215, HDL 36, LDL 133. - Continue lipitor 40 mg PO - Continue aspirin to 81 mg po     Diet: liquid DVT ppx: lovenox  Dispo: Anticipated discharge in >1 day(s).   John Giovanniathore, Joory Gough, MD 06/25/2017, 9:48 AM Pager: [phone]

## 2017-06-25 NOTE — Progress Notes (Signed)
Physical Therapy Wound Evaluation/Treatment Patient Details  Name: Alex Abbott MRN: [id number] Date of Birth: 1951-06-17  Today's Date: 06/25/2017 Time: 1120-1227 Time Calculation (min): 67 min  Subjective  Subjective: Patient relates wounds are chronic and painful. Patient and Family Stated Goals: To eliminate suffering Date of Onset:  (Unknown) Prior Treatments: surgical debridements, daily cleansing and dressing changes   Pain Score: Pain Score: 4   Wound Assessment  Pressure Injury 06/25/17 Unstageable - Full thickness tissue loss in which the base of the ulcer is covered by slough (yellow, tan, gray, green or brown) and/or eschar (tan, brown or black) in the wound bed. (Active)  Dressing Type ABD;Gauze (Comment);Moist to dry 06/25/2017  1:00 PM  Dressing Changed 06/25/2017  1:00 PM  Dressing Change Frequency Twice a day 06/25/2017  1:00 PM  State of Healing Eschar 06/25/2017  1:00 PM  Site / Wound Assessment Granulation tissue;Black;Friable;Yellow 06/25/2017  1:00 PM  % Wound base Red or Granulating 40% 06/25/2017  1:00 PM  % Wound base Yellow/Fibrinous Exudate 35% 06/25/2017  1:00 PM  % Wound base Black/Eschar 25% 06/25/2017  1:00 PM  Peri-wound Assessment Intact 06/25/2017  1:00 PM  Wound Length (cm) 33 cm 06/25/2017  1:00 PM  Wound Width (cm) 15.2 cm 06/25/2017  1:00 PM  Wound Depth (cm) 4 cm 06/25/2017  1:00 PM  Margins Unattached edges (unapproximated) 06/25/2017  1:00 PM  Drainage Amount Moderate 06/25/2017  1:00 PM  Drainage Description Serous;Green;Odor 06/25/2017  1:00 PM  Treatment Hydrotherapy (Pulse lavage);Debridement (Selective);Cleansed;Packing (Saline gauze);Tape changed 06/25/2017  1:00 PM     Pressure Injury 06/25/17 Unstageable - Full thickness tissue loss in which the base of the ulcer is covered by slough (yellow, tan, gray, green or brown) and/or eschar (tan, brown or black) in the wound bed. (Active)  Dressing Type ABD;Gauze (Comment);Moist to dry 06/25/2017  1:00 PM   Dressing Changed 06/25/2017  1:00 PM  Dressing Change Frequency Twice a day 06/25/2017  1:00 PM  State of Healing Early/partial granulation 06/25/2017  1:00 PM  Site / Wound Assessment Granulation tissue;Yellow 06/25/2017  1:00 PM  % Wound base Red or Granulating 80% 06/25/2017  1:00 PM  % Wound base Yellow/Fibrinous Exudate 20% 06/25/2017  1:00 PM  Peri-wound Assessment Intact 06/25/2017  1:00 PM  Wound Length (cm) 12 cm 06/25/2017  1:00 PM  Wound Width (cm) 8 cm 06/25/2017  1:00 PM  Undermining (cm) 6cm to L, 4cm to R 06/25/2017  1:00 PM  Margins Unattached edges (unapproximated) 06/25/2017  1:00 PM  Drainage Amount Moderate 06/25/2017  1:00 PM  Drainage Description Serous;Green;Odor 06/25/2017  1:00 PM  Treatment Cleansed;Hydrotherapy (Pulse lavage);Packing (Saline gauze);Tape changed 06/25/2017  1:00 PM      Hydrotherapy Pulsed lavage therapy - wound location: sacrum to perineum to penile area Pulsed Lavage with Suction (psi): 12 psi Pulsed Lavage with Suction - Normal Saline Used: Other (comment) (2550m) Pulsed Lavage Tip: Tip with splash shield Selective Debridement Selective Debridement - Location: sacrum  Selective Debridement - Tools Used: Forceps;Scalpel;Scissors Selective Debridement - Tissue Removed: yellow fibrinous, black eschar   Wound Assessment and Plan  Wound Therapy - Assess/Plan/Recommendations Wound Therapy - Clinical Statement: Patient presents with extensive wound and delayed healing due to immobility and multiple medical issues including fecal incontinence.  Now s/p diverting colostomy and will benefit from skilled PT for hydrotherapy including pulsed lavage and selective debridement to facilitate healing and progress to allow OOB mobility.  Wound Therapy - Functional Problem List: Immobility, incontinence, decreased sensation  Factors Delaying/Impairing Wound Healing: Altered sensation;Incontinence;Diabetes Mellitus;Infection - systemic/local;Multiple medical  problems Hydrotherapy Plan: Debridement;Dressing change;Patient/family education;Pulsatile lavage with suction Wound Therapy - Frequency: 6X / week Wound Therapy - Current Recommendations: Case manager/social work;WOC nurse Wound Therapy - Follow Up Recommendations: Other (comment) ( [facility]ACH)  Wound Therapy Goals- Improve the function of patient's integumentary system by progressing the wound(s) through the phases of wound healing (inflammation - proliferation - remodeling) by: Decrease Necrotic Tissue to: 50 Decrease Necrotic Tissue - Progress: Goal set today Increase Granulation Tissue to: 50 Increase Granulation Tissue - Progress: Goal set today Decrease Length/Width/Depth by (cm): depth by 1 cm Decrease Length/Width/Depth - Progress: Goal set today Goals/treatment plan/discharge plan were made with and agreed upon by patient/family: Yes Time For Goal Achievement: 7 days Wound Therapy - Potential for Goals: Fair  Goals will be updated until maximal potential achieved or discharge criteria met.  Discharge criteria: when goals achieved, discharge from hospital, MD decision/surgical intervention, no progress towards goals, refusal/missing three consecutive treatments without notification or medical reason.  GP     Reginia Naas 06/25/2017, 1:42 PM  Magda Kiel, [place] 06/25/2017

## 2017-06-25 NOTE — Progress Notes (Signed)
Patient ID: Alex Abbott, male   DOB: July 26, 1951, 66 y.o.   MRN: [id number]   Progress Note: Acute Care Surgery Service   Chief Complaint/Subjective: Pain, no appetite, nausea. PT at beside getting ready to do hydrotherapy  Objective: Vital signs in last 24 hours: Temp:  [97.8 F (36.6 C)-98.6 F (37 C)] 98.6 F (37 C) (07/28 0815) Pulse Rate:  [97-124] 111 (07/28 0815) Resp:  [15-23] 20 (07/28 0427) BP: (90-123)/(53-103) 121/103 (07/28 0815) SpO2:  [92 %-100 %] 100 % (07/28 0427) Last BM Date: 06/25/17 (has colostomy)  Intake/Output from previous day: 07/27 0701 - 07/28 0700 In: 2450 [I.V.:1100; IV Piggyback:250] Out: 400 [Urine:300; Blood:100] Intake/Output this shift: No intake/output data recorded.  Morbidly obese wm in nad Obese, soft, lower midline dressing intact; ostomy L mid abd - stool in bag.   Lab Results: CBC   Recent Labs  06/24/17 0353 06/25/17 0254  WBC 13.2* 14.4*  HGB 8.3* 7.8*  HCT 27.4* 24.8*  PLT 466* 412*   BMET  Recent Labs  06/23/17 1059 06/24/17 0353  NA 132* 132*  K 4.8 4.8  CL 97* 98*  CO2 26 27  GLUCOSE 139* 143*  BUN 25* 21*  CREATININE 1.11 0.98  CALCIUM 8.5* 8.1*   PT/INR  Recent Labs  06/24/17 0353  LABPROT 15.6*  INR 1.23   ABG No results for input(s): PHART, HCO3 in the last 72 hours.  Invalid input(s): PCO2, PO2  Studies/Results:  Anti-infectives: Anti-infectives    Start     Dose/Rate Route Frequency Ordered Stop   06/24/17 1730  piperacillin-tazobactam (ZOSYN) IVPB 3.375 g     3.375 g 12.5 mL/hr over 240 Minutes Intravenous Every 8 hours 06/24/17 1715     06/24/17 1100  ceFAZolin (ANCEF) 3 g in dextrose 5 % 50 mL IVPB     3 g 130 mL/hr over 30 Minutes Intravenous To [facility] 06/24/17 1038 06/24/17 1133   06/24/17 0600  cefoTEtan in Dextrose 5% (CEFOTAN) IVPB 2 g  Status:  Discontinued     2 g Intravenous On call to O.R. 06/23/17 1355 06/23/17 1705      Medications: Scheduled Meds: .  aspirin  81 mg Oral Daily  . atorvastatin  40 mg Oral Daily  . docusate sodium  100 mg Oral BID  . famotidine  20 mg Oral BID  . FLUoxetine  20 mg Oral BID  . heparin  5,000 Units Subcutaneous Q8H  . insulin aspart  0-15 Units Subcutaneous TID WC  . insulin glargine  20 Units Subcutaneous QHS  . metoprolol tartrate  25 mg Oral TID  . vitamin C  500 mg Oral BID  . zinc sulfate  220 mg Oral Daily   Continuous Infusions: . lactated ringers 10 mL/hr at 06/24/17 0956  . piperacillin-tazobactam (ZOSYN)  IV 3.375 g (06/25/17 0806)   PRN Meds:.acetaminophen **OR** acetaminophen, ALPRAZolam, metoprolol tartrate, morphine injection, ondansetron (ZOFRAN) IV, oxyCODONE, polyethylene glycol  Assessment/Plan: Patient Active Problem List   Diagnosis Date Noted  . Pressure injury of skin 06/24/2017  . Permanent atrial fibrillation with RVR (HCC) 06/24/2017  . Buttock wound 06/23/2017  . Fournier's gangrene 06/23/2017   s/p Procedure(s): LAPAROSCOPIC ASSISSTED END COLOSTOMY 06/24/2017 by Dr Fredricka Bonineonnor  Fournier's gangrene with extensive invasion of the perineum, the penis, left and right buttocks, and the sacrum Sepsis secondary to Fournier's Spinal cord infarction with paraplegia, no motor or sensation below the umbilicus. 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 Chronic atrial  fibrillation - aspirin currently for anticoagulation he has declined other anticoagulants CAD - no reported history of MI or prior interventional treatments. Type 2 diabetes - poorly controlled. He reports glucoses in the 500 range prior to hospitalization Hypertension Hyperlipidemia Morbid obesity Probable sleep apnea History of heavy tobacco and alcohol use in the past Remote history of drug use History of bipolar disorder and anxiety disorder. Acute anemia Hyponatremia  Encouraged pt to just do liquids today since he is nauseous.  Cont wound care pulm toilet Chemical vte  prophylaxis  Disposition:  LOS: 2 days    Atilano InaWILSON,Delyla Sandeen M, MD [phone] [facility]

## 2017-06-25 NOTE — Progress Notes (Signed)
  Date: 06/25/2017  Patient name: Alex Abbott  Medical record number: [id number]  Date of birth: [birth year]   This patient's plan of care was discussed with the house staff. Please see their note for complete details. I concur with their findings.   [name], Michaelanthony Kempton B, MD 06/25/2017, 8:33 PM

## 2017-06-25 NOTE — Evaluation (Signed)
Physical Therapy Evaluation Patient Details Name: Alex GuysCharles L Marner MRN: [id number] DOB: 01/18/1951 Today's Date: 06/25/2017   History of Present Illness  66 year old male with PMH of poorly controlled type 2 diabetes, atrial fibrillation not on anticoagulation, chronic pain, recent diagnosis of Fournier's gangrene status post [facility], CKD who presented with nonhealing wounds from the long-term acute care facility [facility]. Pt is now s/p Laparoscopic-assisted creation of end sigmoid colostomy. History of spinal cord infarct, paraplegic BLE.   Clinical Impression  Patient presents with significant dependencies for mobility due to deficits listed below.  Currently unable to tolerate sitting EOB due to pain as well as buttocks and perineal wounds.  Feel currently appropriate for limited skilled PT to address deficits in bed mobility and LE ROM.  Will follow acutely and progress as tolerated.  [facility]     Follow Up Recommendations LTACH;Supervision/Assistance - 24 hour    Equipment Recommendations  Other (comment) (TBA)    Recommendations for Other Services       Precautions / Restrictions Precautions Precautions: Fall Precaution Comments: large wound entire perineum, sacrum and penis      Mobility  Bed Mobility Overal bed mobility: Needs Assistance Bed Mobility: Rolling Rolling: Total assist;+2 for physical assistance         General bed mobility comments: rolling to R and to L; pt states uable due to paralyzed, but encourage to use upper body to assist with railing; limited participation due to reports of pain  Transfers                 General transfer comment: unable due to wounds and paralysis  Ambulation/Gait                Stairs            Wheelchair Mobility    Modified Rankin (Stroke Patients Only)       Balance                                              Pertinent Vitals/Pain Faces Pain Scale: Hurts whole lot Pain Location: back/buttocks, abdomen Pain Intervention(s): Monitored during session;Repositioned    Home Living Family/patient expects to be discharged to:: Other (Comment) (LTACH) Living Arrangements: Alone                    Prior Function Level of Independence: Needs assistance   Gait / Transfers Assistance Needed: paraplegia relatively recently. Was previously walking with walker.   ADL's / Homemaking Assistance [facility], speaks about walking in his home with a walker prior to recent changes in health status. did not drive.         Sackrider Dominance        Extremity/Trunk Assessment   Upper Extremity Assessment Upper Extremity Assessment: Defer to OT evaluation    Lower Extremity Assessment Lower Extremity Assessment: RLE deficits/detail;LLE deficits/detail RLE Deficits / Details: no active movement bilateral LE's; note heel cord tightness and stiffness in hips with pain upon ROM due to wounds RLE Sensation: decreased light touch LLE Deficits / Details: no active movement bilateral LE's; note heel cord tightness and stiffness in hips with pain upon ROM due to wounds LLE Sensation: decreased light touch       Communication   Communication: No difficulties  Cognition Arousal/Alertness: Awake/alert  Behavior During Therapy: Anxious Overall Cognitive Status: No family/caregiver present to determine baseline cognitive functioning                                        General Comments General comments (skin integrity, edema, etc.): see wound assessment for details on perineal/sacral wounds    Exercises Other Exercises Other Exercises: LE PROM during mobility   Assessment/Plan    PT Assessment Patient needs continued PT services  PT Problem List Decreased range of motion;Decreased strength;Decreased mobility;Decreased activity tolerance;Impaired sensation       PT  Treatment Interventions Therapeutic activities;Therapeutic exercise;Patient/family education;Functional mobility training;Balance training    PT Goals (Current goals can be found in the Care Plan section)  Acute Rehab PT Goals Patient Stated Goal: To stop suffering PT Goal Formulation: With patient Time For Goal Achievement: 07/09/17 Potential to Achieve Goals: Fair    Frequency Min 2X/week   Barriers to discharge        Co-evaluation               AM-PAC PT "6 Clicks" Daily Activity  Outcome Measure Difficulty turning over in bed (including adjusting bedclothes, sheets and blankets)?: Total Difficulty moving from lying on back to sitting on the side of the bed? : Total Difficulty sitting down on and standing up from a chair with arms (e.g., wheelchair, bedside commode, etc,.)?: Total Help needed moving to and from a bed to chair (including a wheelchair)?: Total Help needed walking in hospital room?: Total Help needed climbing 3-5 steps with a railing? : Total 6 Click Score: 6    End of Session   Activity Tolerance: Patient limited by pain;Treatment limited secondary to medical complications (Comment) (severe wounds) Patient left: in bed;with call bell/phone within reach   PT Visit Diagnosis: Other symptoms and signs involving the nervous system (R29.898);Pain    Time: 1115-1135 PT Time Calculation (min) (ACUTE ONLY): 20 min   Charges:   PT Evaluation $PT Eval Moderate Complexity: 1 Procedure     PT G CodesSheran Lawless:        Cyndi Jakeb Lamping, PT [phone] 06/25/2017   Elray Mcgregorynthia Archana Eckman 06/25/2017, 6:03 PM

## 2017-06-26 LAB — CBC
HCT: 21.9 % — ABNORMAL LOW (ref 39.0–52.0)
HEMOGLOBIN: 7 g/dL — AB (ref 13.0–17.0)
MCH: 30.7 pg (ref 26.0–34.0)
MCHC: 32 g/dL (ref 30.0–36.0)
MCV: 96.1 fL (ref 78.0–100.0)
Platelets: 330 10*3/uL (ref 150–400)
RBC: 2.28 MIL/uL — AB (ref 4.22–5.81)
RDW: 16.4 % — ABNORMAL HIGH (ref 11.5–15.5)
WBC: 16.3 10*3/uL — ABNORMAL HIGH (ref 4.0–10.5)

## 2017-06-26 LAB — GLUCOSE, CAPILLARY
GLUCOSE-CAPILLARY: 195 mg/dL — AB (ref 65–99)
GLUCOSE-CAPILLARY: 218 mg/dL — AB (ref 65–99)
Glucose-Capillary: 143 mg/dL — ABNORMAL HIGH (ref 65–99)
Glucose-Capillary: 178 mg/dL — ABNORMAL HIGH (ref 65–99)

## 2017-06-26 MED ORDER — MORPHINE SULFATE (PF) 4 MG/ML IV SOLN
INTRAVENOUS | Status: AC
  Administered 2017-06-26: 2 mg
  Filled 2017-06-26: qty 1

## 2017-06-26 NOTE — Care Management Note (Signed)
Case Management Note Donn PieriniKristi Cordell Guercio RN, BSN Unit 4E-Case Manager [phone]  Patient Details  Name: Alex Abbott MRN: [id number] Date of Birth: 1950-12-31  Subjective/Objective:   Pt admitted from [facility] for non-healing wound LTACH requested evaluation for possible diverting colostomy.- pt s/p Laparoscopic-assisted creation of end sigmoid colostomy on 06/24/17                   Action/Plan: [facility]- having spoken with Alcario DroughtErica at [facility] as pt is on LOA with [facility] expect to return to [facility]- will f/u on Monday regarding return- [facility] can not accept pt today 7/29- CM will f/u in AM  Expected Discharge Date:                  Expected Discharge Plan:  Long Term Acute Care (LTAC)  In-House Referral:     Discharge planning Services  CM Consult  Post Acute Care Choice:  NA Choice offered to:  NA  DME Arranged:    DME Agency:     HH Arranged:    HH Agency:     Status of Service:  In process, will continue to follow  If discussed at Long Length of Stay Meetings, dates discussed:    Discharge Disposition:   Additional Comments:  Darrold SpanWebster, Sabre Romberger Hall, RN 06/26/2017, 11:38 AM

## 2017-06-26 NOTE — Progress Notes (Signed)
  Date: 06/26/2017  Patient name: Alex Abbott  Medical record number: [id number]  Date of birth: [birth year]   I have seen and evaluated this patient and I have discussed the plan of care with the house staff. Please see Dr. Gardiner Rhymeathore's note for complete details. I concur with her findings with the following additions/corrections:   SW will be consulted for new LTAC.    [name], Damieon Armendariz B, MD 06/26/2017, 2:49 PM

## 2017-06-26 NOTE — Progress Notes (Signed)
Patient ID: Alex Abbott, male   DOB: Sep 14, 1951, 66 y.o.   MRN: [id number]   Progress Note: Acute Care Surgery Service   Chief Complaint/Subjective: Denies nausea. Has stool at stoma. Getting ready to eat.   Objective: Vital signs in last 24 hours: Temp:  [98.1 F (36.7 C)-98.8 F (37.1 C)] 98.2 F (36.8 C) (07/29 0810) Pulse Rate:  [96-125] 103 (07/29 0810) Resp:  [15-20] 18 (07/29 0810) BP: (100-109)/(66-84) 109/70 (07/29 0810) SpO2:  [95 %-97 %] 95 % (07/29 0810) FiO2 (%):  [2 %] 2 % (07/29 0528) Last BM Date: 06/25/17  Intake/Output from previous day: 07/28 0701 - 07/29 0700 In: 540 [P.O.:300; I.V.:90; IV Piggyback:150] Out: 1350 [Urine:1350] Intake/Output this shift: No intake/output data recorded.  Morbidly obese wm in nad Obese. Midline and right hemiabdominal incisions clean, dry intact without signs of infection. Left sided end colostomy is flush with skin surface with some blackened mucosa    Lab Results: CBC   Recent Labs  06/24/17 0353 06/25/17 0254  WBC 13.2* 14.4*  HGB 8.3* 7.8*  HCT 27.4* 24.8*  PLT 466* 412*   BMET  Recent Labs  06/23/17 1059 06/24/17 0353  NA 132* 132*  K 4.8 4.8  CL 97* 98*  CO2 26 27  GLUCOSE 139* 143*  BUN 25* 21*  CREATININE 1.11 0.98  CALCIUM 8.5* 8.1*   PT/INR  Recent Labs  06/24/17 0353  LABPROT 15.6*  INR 1.23   ABG No results for input(s): PHART, HCO3 in the last 72 hours.  Invalid input(s): PCO2, PO2  Studies/Results:  Anti-infectives: Anti-infectives    Start     Dose/Rate Route Frequency Ordered Stop   06/24/17 1730  piperacillin-tazobactam (ZOSYN) IVPB 3.375 g     3.375 g 12.5 mL/hr over 240 Minutes Intravenous Every 8 hours 06/24/17 1715     06/24/17 1100  ceFAZolin (ANCEF) 3 g in dextrose 5 % 50 mL IVPB     3 g 130 mL/hr over 30 Minutes Intravenous To [facility] 06/24/17 1038 06/24/17 1133   06/24/17 0600  cefoTEtan in Dextrose 5% (CEFOTAN) IVPB 2 g  Status:  Discontinued     2 g Intravenous On call to O.R. 06/23/17 1355 06/23/17 1705      Medications: Scheduled Meds: . aspirin  81 mg Oral Daily  . atorvastatin  40 mg Oral Daily  . docusate sodium  100 mg Oral BID  . enoxaparin (LOVENOX) injection  40 mg Subcutaneous Q24H  . famotidine  20 mg Oral BID  . FLUoxetine  20 mg Oral BID  . insulin aspart  0-15 Units Subcutaneous TID WC  . insulin glargine  20 Units Subcutaneous QHS  . metoprolol tartrate  50 mg Oral BID  . vitamin C  500 mg Oral BID  . zinc sulfate  220 mg Oral Daily   Continuous Infusions: . lactated ringers 10 mL/hr at 06/24/17 0956  . piperacillin-tazobactam (ZOSYN)  IV 3.375 g (06/26/17 0234)   PRN Meds:.acetaminophen **OR** acetaminophen, ALPRAZolam, metoprolol tartrate, morphine injection, ondansetron (ZOFRAN) IV, oxyCODONE, polyethylene glycol  Assessment/Plan: Patient Active Problem List   Diagnosis Date Noted  . Pressure injury of skin 06/24/2017  . Permanent atrial fibrillation with RVR (HCC) 06/24/2017  . Buttock wound 06/23/2017  . 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 Chronic atrial fibrillation - aspirin currently for anticoagulation he has declined other anticoagulants CAD - no reported history of MI or prior interventional treatments. Type 2 diabetes - poorly controlled. He reports glucoses in the 500 range prior to hospitalization Hypertension Hyperlipidemia Morbid obesity Probable sleep apnea History of heavy tobacco and alcohol use in the past Remote history of drug use History of bipolar disorder and anxiety disorder. Acute  anemia Hyponatremia Stage IV sacral decubitus ulcer  Diet as tolerated Stoma retraction is not surprising given habitus  Cont wound care pulm toilet Chemical vte prophylaxis  Disposition:  LOS: 3 days    Berna Buehelsea A Husein Guedes, MD [phone] [facility]

## 2017-06-26 NOTE — Progress Notes (Signed)
Subjective: Patient states his back pain has now resolved. Reports having mild neck discomfort due to poor positioning in bed. States he does not have much of an appetite. Denies having any nausea or vomiting. No other complaints. I discussed PT/ OT recommendation [facility]. Patient expressed his desire to not go back to [facility].   Objective:  Vital signs in last 24 hours: Vitals:   06/25/17 2157 06/26/17 0528 06/26/17 0810 06/26/17 1200  BP: 103/73 104/66 109/70 105/75  Pulse: (!) 125 96 (!) 103 96  Resp:  15 18 19   Temp:  98.1 F (36.7 C) 98.2 F (36.8 C) 98.1 F (36.7 C)  TempSrc:  Oral Oral Oral  SpO2:  97% 95% 94%  Weight:      Height:       Physical Exam  Constitutional: He appears well-developed and well-nourished. No distress.  Lying comfortably in bed  HENT:  Head: Normocephalic and atraumatic.  Cardiovascular: Normal rate, normal heart sounds and intact distal pulses.   Pulmonary/Chest: Effort normal and breath sounds normal. No respiratory distress. He has no wheezes.  Abdominal: Soft. Bowel sounds are normal. He exhibits no distension. There is no tenderness.  A small amount of brown colored stool present in ostomy bag.   Skin: He is not diaphoretic.  Perineal wound covered in dressing which appears c/d/i. Buttock wound not examined as patient was not able to move in bed due to poor positioning/ large body habitus.   Trace pitting edema in b/l LEs up to mid-shin level     Assessment/Plan:  Principal Problem:   Fournier's gangrene Active Problems:   Buttock wound   Pressure injury of skin   Permanent atrial fibrillation with RVR (HCC)  1. Chronic non-healing Fournier gangrene: S/p surgery for diverting ostomy on 7/27. Persistent slight leukocytosis to 16.3. Patient remains afebrile. Blood cx x 2 drawn 7/27 with NGTD. Slight drop of Hgb from 8.3 pre-op to 7.0 today, likely due to surgical blood loss. [facility] medically [facility] per PT/ OT recommendation. Patient does not want to go back to [facility]. Discussed with social work.   - Transfer to tele - CM diet    - Continue Zosyn  - f/u final blood cx x 2  - f/u repeat CBC tomm. Am. Transfuse if Hgb <7.  - Keep suprapubic cath in place for urinary diversion    - WOCN following  - Surgery following, appreciate recs   - CSW/ CM following, appreciate help   2. T2DM: Repeat A1c 6.9. Last A1c in March was 13.8. Likely secondary to starvation and poor nutrition in setting of prolonged recent hospitalization and being s/p multiple surgeries.  - Continue home Lantus at a lower dose: 20 u qhs - SSI-M - Hold home glyburide + metformin   3. Hypoalbuminemia and nutrition: Persistently low albumin of 1.2. Patient has trace pitting b/l LE edema up to mid-shin level. [facility]18 showed normal EF estimated at 60-65%. Edema likely 2/2 hypoalbuminemia.  - nutrition following, appreciate recs   - vit C, zinc supplements  4. A-fib: CHADS-VASC = 1. Rate in 90s at present.  - Continue metoprolol 50 mg BID - IV metoprolol 2.5 mg prn HR >120 and SBP >/=110  5. Pain control: Not complaining of significant pain at present.  - Continue oxycodone IR 5 mg q6hr PRN - Continue morphine 2 mg IV q6hr PRN   6. MDD and anxiety:  Uses xanax and prozac, continuing home meds. Per prior records anxiety is a significant contributor to ongoing distress.  - Prozac 20 mg PO BID  - Xanax 0.5 mg PO TID PRN   7. ASCVD risk: 10-year ASCVD score = 22.6%. Most recent lipid profile from March 2018 was triglyceride 229, cholesterol 215, HDL 36, LDL 133. - Continue lipitor 40 mg PO - Continue aspirin to 81 mg po     Diet: CM DVT ppx: lovenox  Dispo: Anticipated discharge in >1 day(s).   Alex Abbott, Tai Skelly, MD 06/26/2017, 1:41 PM Pager: [phone]

## 2017-06-27 ENCOUNTER — Inpatient Hospital Stay
Admission: AD | Admit: 2017-06-27 | Discharge: 2017-07-14 | Disposition: A | Discharge status: Skilled Nursing Facility | Payer: Medicare Other | Source: Ambulatory Visit | Attending: Internal Medicine | Admitting: Internal Medicine

## 2017-06-27 DIAGNOSIS — R52 Pain, unspecified: Secondary | ICD-10-CM

## 2017-06-27 DIAGNOSIS — Z87891 Personal history of nicotine dependence: Secondary | ICD-10-CM

## 2017-06-27 DIAGNOSIS — D72829 Elevated white blood cell count, unspecified: Secondary | ICD-10-CM

## 2017-06-27 LAB — COMPREHENSIVE METABOLIC PANEL
ALK PHOS: 150 U/L — AB (ref 38–126)
ALT: 17 U/L (ref 17–63)
ANION GAP: 5 (ref 5–15)
AST: 20 U/L (ref 15–41)
Albumin: 1.3 g/dL — ABNORMAL LOW (ref 3.5–5.0)
BILIRUBIN TOTAL: 0.5 mg/dL (ref 0.3–1.2)
BUN: 16 mg/dL (ref 6–20)
CALCIUM: 8.1 mg/dL — AB (ref 8.9–10.3)
CO2: 29 mmol/L (ref 22–32)
CREATININE: 1.18 mg/dL (ref 0.61–1.24)
Chloride: 100 mmol/L — ABNORMAL LOW (ref 101–111)
GFR calc non Af Amer: >60 mL/min (ref >60)
Glucose, Bld: 193 mg/dL — ABNORMAL HIGH (ref 65–99)
Potassium: 4.8 mmol/L (ref 3.5–5.1)
Sodium: 134 mmol/L — ABNORMAL LOW (ref 135–145)
TOTAL PROTEIN: 5.1 g/dL — AB (ref 6.5–8.1)

## 2017-06-27 LAB — GLUCOSE, CAPILLARY
GLUCOSE-CAPILLARY: 210 mg/dL — AB (ref 65–99)
Glucose-Capillary: 185 mg/dL — ABNORMAL HIGH (ref 65–99)
Glucose-Capillary: 266 mg/dL — ABNORMAL HIGH (ref 65–99)

## 2017-06-27 LAB — PREALBUMIN

## 2017-06-27 LAB — CBC
HEMATOCRIT: 23.9 % — AB (ref 39.0–52.0)
HEMOGLOBIN: 7.3 g/dL — AB (ref 13.0–17.0)
MCH: 29.1 pg (ref 26.0–34.0)
MCHC: 30.5 g/dL (ref 30.0–36.0)
MCV: 95.2 fL (ref 78.0–100.0)
Platelets: 377 10*3/uL (ref 150–400)
RBC: 2.51 MIL/uL — AB (ref 4.22–5.81)
RDW: 16 % — ABNORMAL HIGH (ref 11.5–15.5)
WBC: 18 10*3/uL — ABNORMAL HIGH (ref 4.0–10.5)

## 2017-06-27 MED ORDER — METOPROLOL TARTRATE 50 MG PO TABS: 50.0000 mg | ORAL_TABLET | Freq: Two times a day (BID) | ORAL | 0 refills | Status: AC

## 2017-06-27 MED ORDER — ACETAMINOPHEN 500 MG PO TABS: 1000.0000 mg | ORAL_TABLET | Freq: Four times a day (QID) | ORAL | Status: DC | PRN

## 2017-06-27 NOTE — Care Management Important Message (Signed)
Important Message  Patient Details  Name: Alex Abbott MRN: [id number] Date of Birth: May 04, 1951   Medicare Important Message Given:  Yes    [name] 06/27/2017, 2:28 PM

## 2017-06-27 NOTE — Progress Notes (Signed)
Internal Medicine Attending:   I saw and examined the patient. I reviewed the resident's note and I agree with the resident's findings and plan as documented in the resident's note.  Patient complains of significant pain this morning while having his wounds packed and being moved. Patient was initially admitted to have a diary and colostomy in the setting of nonhealing wounds from Fournier's gangrene. He is now status post a diverting colostomy on 06/24/2017. His wounds do have possible purulent drainage and some areas of necrosis. He is afebrile but his leukocytosis continues to worsen. Blood cultures have no growth to date. We'll continue with IV Zosyn for now. Will discuss antibiotics with infectious disease given his worsening leukocytosis despite being on antibiotics. Patient will need placement in LTAC on discharge. We will continue pain control for now. No further workup at this time.

## 2017-06-27 NOTE — Progress Notes (Signed)
Physical Therapy Wound Treatment Patient Details  Name: Alex Abbott MRN: [id number] Date of Birth: Dec 23, 1950  Today's Date: 06/27/2017 Time: 2683-4196 Time Calculation (min): 73 min  Subjective  Subjective: Pt reports he just wants to be able to be put to sleep until all of his medical issues are resolved. Patient and Family Stated Goals: To be comfortable Date of Onset:  (Unknown) Prior Treatments: surgical debridements, daily cleansing and dressing changes   Pain Score: Pain Score: Pt premedicated with IV morphine and nurse in to give oral meds after treatment. Pt reports pain primarily in abdomen with pressure when turning for positioning.  Wound Assessment  Pressure Injury 06/25/17 Unstageable - Full thickness tissue loss in which the base of the ulcer is covered by slough (yellow, tan, gray, green or brown) and/or eschar (tan, brown or black) in the wound bed. (Active)  Dressing Type ABD;Gauze (Comment);Moist to dry;Barrier Film (skin prep) 06/27/2017  2:00 PM  Dressing Changed 06/27/2017  2:00 PM  Dressing Change Frequency Twice a day 06/27/2017  2:00 PM  State of Healing Eschar 06/27/2017  2:00 PM  Site / Wound Assessment Granulation tissue;Black;Friable;Yellow 06/27/2017  2:00 PM  % Wound base Red or Granulating 40% 06/27/2017  2:00 PM  % Wound base Yellow/Fibrinous Exudate 40% 06/27/2017  2:00 PM  % Wound base Black/Eschar 20% 06/27/2017  2:00 PM  % Wound base Other/Granulation Tissue (Comment) 0% 06/27/2017  2:00 PM  Peri-wound Assessment Intact 06/27/2017  2:00 PM  Wound Length (cm) 33 cm 06/25/2017  1:00 PM  Wound Width (cm) 15.2 cm 06/25/2017  1:00 PM  Wound Depth (cm) 4 cm 06/25/2017  1:00 PM  Margins Unattached edges (unapproximated) 06/27/2017  2:00 PM  Drainage Amount Moderate 06/27/2017  2:00 PM  Drainage Description Green;Odor 06/27/2017  2:00 PM  Treatment Debridement (Selective);Hydrotherapy (Pulse lavage);Packing (Saline gauze) 06/27/2017  2:00 PM     Pressure Injury  06/25/17 Unstageable - Full thickness tissue loss in which the base of the ulcer is covered by slough (yellow, tan, gray, green or brown) and/or eschar (tan, brown or black) in the wound bed. (Active)  Dressing Type ABD;Gauze (Comment);Moist to dry 06/27/2017  2:00 PM  Dressing Changed 06/27/2017  2:00 PM  Dressing Change Frequency Twice a day 06/27/2017  2:00 PM  State of Healing Early/partial granulation 06/27/2017  2:00 PM  Site / Wound Assessment Granulation tissue;Yellow 06/27/2017  2:00 PM  % Wound base Red or Granulating 80% 06/27/2017  2:00 PM  % Wound base Yellow/Fibrinous Exudate 20% 06/27/2017  2:00 PM  % Wound base Black/Eschar 0% 06/27/2017  2:00 PM  % Wound base Other/Granulation Tissue (Comment) 0% 06/27/2017  2:00 PM  Peri-wound Assessment Intact 06/27/2017  2:00 PM  Wound Length (cm) 12 cm 06/25/2017  1:00 PM  Wound Width (cm) 8 cm 06/25/2017  1:00 PM  Undermining (cm) 6cm to L, 4cm to R 06/25/2017  1:00 PM  Margins Unattached edges (unapproximated) 06/27/2017  2:00 PM  Drainage Amount Moderate 06/27/2017  2:00 PM  Drainage Description Green;Odor 06/27/2017  2:00 PM  Treatment Hydrotherapy (Pulse lavage);Packing (Saline gauze) 06/27/2017  2:00 PM      Hydrotherapy Pulsed lavage therapy - wound location: sacrum to perineum to penile area Pulsed Lavage with Suction (psi): 12 psi (4-12) Pulsed Lavage with Suction - Normal Saline Used: 2000 mL Pulsed Lavage Tip: Tip with splash shield Selective Debridement [facility] Debridement - Location: sacrum  [facility] Debridement - Tools Used: Forceps;Scissors Selective Debridement - Tissue Removed: yellow and black necrotic  tissue   Wound Assessment and Plan  Wound Therapy - Assess/Plan/Recommendations Wound Therapy - Clinical Statement: Pt continues to have massive perineal and buttock/sacral wound. Slow progress with removal of necrotic tissue. Pt with definite blue green drainage from wound. MD's in to see wound during treatment. Wound Therapy  - Functional Problem List: Immobility, incontinence, decreased sensation Factors Delaying/Impairing Wound Healing: Altered sensation;Incontinence;Diabetes Mellitus;Infection - systemic/local;Multiple medical problems;Immobility;Polypharmacy Hydrotherapy Plan: Debridement;Dressing change;Patient/family education;Pulsatile lavage with suction Wound Therapy - Frequency: 6X / week Wound Therapy - Current Recommendations: Case manager/social work;WOC nurse Wound Therapy - Follow Up Recommendations: Other (comment) ( LTACH) Wound Plan: See above  Wound Therapy Goals- Improve the function of patient's integumentary system by progressing the wound(s) through the phases of wound healing (inflammation - proliferation - remodeling) by: Decrease Necrotic Tissue to: 50 Decrease Necrotic Tissue - Progress: Progressing toward goal Increase Granulation Tissue to: 50 Increase Granulation Tissue - Progress: Progressing toward goal Decrease Length/Width/Depth by (cm): depth by 1 cm Decrease Length/Width/Depth - Progress: Progressing toward goal  Goals will be updated until maximal potential achieved or discharge criteria met.  Discharge criteria: when goals achieved, discharge from hospital, MD decision/surgical intervention, no progress towards goals, refusal/missing three consecutive treatments without notification or medical reason.  GP     Shary Decamp [place]ok 06/27/2017, 2:49 PM [employer] PT [phone]

## 2017-06-27 NOTE — Progress Notes (Signed)
   06/27/17 1050  Clinical Encounter Type  Visited With Patient  Visit Type Other (Comment) ([place] consult)  Spiritual Encounters  Spiritual Needs Emotional  Stress Factors  Patient Stress Factors Health changes  Introduction to Pt. Offered emotional and empathetic support.

## 2017-06-27 NOTE — Progress Notes (Signed)
Progress Note  Patient Name: Alex Abbott L Pendry Date of Encounter: 06/27/2017  Primary Cardiologist: Dr. Herbie BaltimoreHarding  Subjective   No complaints of CP, SOB or palpitations  Inpatient Medications    Scheduled Meds: . aspirin  81 mg Oral Daily  . atorvastatin  40 mg Oral Daily  . docusate sodium  100 mg Oral BID  . enoxaparin (LOVENOX) injection  40 mg Subcutaneous Q24H  . famotidine  20 mg Oral BID  . FLUoxetine  20 mg Oral BID  . insulin aspart  0-15 Units Subcutaneous TID WC  . insulin glargine  20 Units Subcutaneous QHS  . metoprolol tartrate  50 mg Oral BID  . vitamin C  500 mg Oral BID  . zinc sulfate  220 mg Oral Daily   Continuous Infusions: . piperacillin-tazobactam (ZOSYN)  IV Stopped (06/27/17 1333)   PRN Meds: acetaminophen, ALPRAZolam, metoprolol tartrate, morphine injection, ondansetron (ZOFRAN) IV, oxyCODONE, polyethylene glycol   Vital Signs    Vitals:   06/26/17 2000 06/27/17 0544 06/27/17 0800 06/27/17 1200  BP: 114/63 109/69 102/78   Pulse: (!) 101 (!) 111 (!) 107   Resp: (!) 21 20 19    Temp: 99 F (37.2 C) 98.2 F (36.8 C) 97.7 F (36.5 C) 98 F (36.7 C)  TempSrc: Oral Oral Oral Oral  SpO2:      Weight:      Height:        Intake/Output Summary (Last 24 hours) at 06/27/17 1332 Last data filed at 06/27/17 1234  Gross per 24 hour  Intake              990 ml  Output             2050 ml  Net            -1060 ml   Filed Weights   06/23/17 1016 06/23/17 2051 06/24/17 0944  Weight: 280 lb (127 kg) 280 lb 6.8 oz (127.2 kg) 280 lb 6.8 oz (127.2 kg)    Telemetry    Atrial fibrillation with HR 90-110bpm - Personally Reviewed  ECG    No new EKG to review - Personally Reviewed  Physical Exam   GEN: No acute distress.   Neck: No JVD Cardiac: RRR, no murmurs, rubs, or gallops.  Respiratory: Clear to auscultation bilaterally. GI: Soft, nontender, non-distended  MS: No edema; No deformity. Neuro:  Nonfocal  Psych: Normal affect   Labs      Chemistry Recent Labs Lab 06/23/17 1059 06/24/17 0353 06/27/17 0521  NA 132* 132* 134*  K 4.8 4.8 4.8  CL 97* 98* 100*  CO2 26 27 29   GLUCOSE 139* 143* 193*  BUN 25* 21* 16  CREATININE 1.11 0.98 1.18  CALCIUM 8.5* 8.1* 8.1*  PROT  --  5.2* 5.1*  ALBUMIN  --  1.2* 1.3*  AST  --  28 20  ALT  --  37 17  ALKPHOS  --  183* 150*  BILITOT  --  0.5 0.5  GFRNONAA >60 >60 >60  GFRAA >60 >60 >60  ANIONGAP 9 7 5      Hematology Recent Labs Lab 06/25/17 0254 06/26/17 0945 06/27/17 0521  WBC 14.4* 16.3* 18.0*  RBC 2.65* 2.28* 2.51*  HGB 7.8* 7.0* 7.3*  HCT 24.8* 21.9* 23.9*  MCV 93.6 96.1 95.2  MCH 29.4 30.7 29.1  MCHC 31.5 32.0 30.5  RDW 16.1* 16.4* 16.0*  PLT 412* 330 377    Cardiac Enzymes Recent Labs Lab 06/24/17 1536  TROPONINI 0.04*   No results for input(s): TROPIPOC in the last 168 hours.   BNPNo results for input(s): BNP, PROBNP in the last 168 hours.   DDimer No results for input(s): DDIMER in the last 168 hours.   Radiology    No results found.  Cardiac Studies   none  Patient Profile     66 y.o. male with a hx of atrial fib x 30 years or more, never anticoagulated, DM, HLD, HTN, morbid obesity, CKD III, possible CAD (told by PCP but never had + testing), bipolar d/o and chronic pain issues, who was admitted with fecal incontinence and Fournier's gangrene of the buttocks s/p laparoscopic assisted creation of end sigmoid colostomy.  Cardiology was consulted for atrial fibrillation ith RVR.     Assessment & Plan    1.  Permanent atrial fibrillation - HR improved.  Ranging around 90-110bpm.   - he will continue on Lopressor 50mg  BID which was increased today.  OK to continue to titrate as needed for HR control.  His BP is good so have room to increase BB if needed. - he has not been on anticoagulation in the past with his chronic afib (over 30 year duration) and right now likely not a candidate given current medical issues.  CHADS2VASC score is 1  (DM).    No other recs at this time.  Will sign off.  Call with any questions.    Signed, Armanda Magicraci Faithann Natal, MD  06/27/2017, 1:32 PM

## 2017-06-27 NOTE — Progress Notes (Signed)
Pharmacy Antibiotic Note  Alex Abbott is a 66 y.o. male admitted on 06/23/2017 with a perineal wound infection.  Pharmacy has been consulted for zosyn dosing. WBC continues to increase, up to 18.0 today. Pt is afebrile. Scr is stable, 1.18 today with estimated CrCl ~88 mL/min.  Plan: 1. Continue Zosyn 3.375g IV q 8 hrs  2. F/u final culture results, renal function, and clinical course  Height: 6' (182.9 cm) Weight: 280 lb 6.8 oz (127.2 kg) IBW/kg (Calculated) : 77.6  Temp (24hrs), Avg:98.5 F (36.9 C), Min:98.1 F (36.7 C), Max:99 F (37.2 C)   Recent Labs Lab 06/22/17 0715 06/23/17 1059 06/24/17 0353 06/25/17 0254 06/26/17 0945 06/27/17 0521  WBC 8.9 13.0* 13.2* 14.4* 16.3* 18.0*  CREATININE 1.09 1.11 0.98  --   --  1.18    Estimated Creatinine Clearance: 88.3 mL/min (by C-G formula based on SCr of 1.18 mg/dL).    Allergies  Allergen Reactions  . Propafenone Other (See Comments)    Weak     Antimicrobials this admission: Cefazolin 7/27 x 1  Zosyn 7/27 >>   Microbiology results: 7/27 BCx x2: NGTD x2 7/26  MRSA PCR: neg  Thank you for allowing pharmacy to be a part of this patient's care.  Roderic ScarceErin N. Zigmund Abbott, PharmD PGY1 Pharmacy Resident Pager: [phone]   06/27/2017 9:59 AM

## 2017-06-27 NOTE — Discharge Summary (Signed)
Name: Alex Abbott MRN: [id number] DOB: 29-May-1951 66 y.o. PCP: Patient, No Pcp Per  Date of Admission: 06/23/2017  9:57 AM Date of Discharge: 06/27/2017 Attending Physician: [name], Emily B, MD  Discharge Diagnosis: 1. 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 2. A-fib with RVR. 3. Type 2 Diabetes Mellitus, Uncontrolled. 4. Malnutrition.   Principal Problem:   Fournier's gangrene Active Problems:   Buttock wound   Pressure injury of skin   Permanent atrial fibrillation with RVR (HCC)  Discharge Medications: Allergies as of 06/27/2017      Reactions   Propafenone Other (See Comments)   Weak       Medication List    STOP taking these medications   docusate sodium 100 MG capsule Commonly known as:  COLACE   polyethylene glycol packet Commonly known as:  MIRALAX / GLYCOLAX   potassium chloride SA 20 MEQ tablet Commonly known as:  K-DUR,KLOR-CON   sodium polystyrene 15 GM/60ML suspension Commonly known as:  KAYEXALATE     TAKE these medications   acetaminophen 325 MG tablet Commonly known as:  TYLENOL Take 650 mg by mouth every 6 (six) hours as needed for mild pain.   ALPRAZolam 0.5 MG tablet Commonly known as:  XANAX Take 0.5 mg by mouth 3 (three) times daily as needed for anxiety.   aspirin 325 MG tablet Take 325 mg by mouth daily.   atorvastatin 10 MG tablet Commonly known as:  LIPITOR Take 20 mg by mouth daily.   famotidine 20 MG tablet Commonly known as:  PEPCID Take 20 mg by mouth 2 (two) times daily.   FLUoxetine 20 MG capsule Commonly known as:  PROZAC Take 20 mg by mouth 2 (two) times daily.   GLUCAGON EMERGENCY 1 MG injection Generic drug:  glucagon Inject 1 mg into the vein once as needed.   insulin glargine 100 UNIT/ML injection Commonly known as:  LANTUS Inject 20 Units into the skin 2 (two) times daily.   insulin lispro 100 UNIT/ML injection Commonly known as:  HUMALOG Inject 0-16 Units into the skin  See admin instructions. Per sliding scale for BS 70-150=0 units, 151-200=4 units, 201-250=8 units, 251-300=10 units, 301-350=12 units, 351-400=16 units, BG>400 give 16 units and call MD   magnesium oxide 400 MG tablet Commonly known as:  MAG-OX Take 400 mg by mouth every 6 (six) hours as needed.   metoprolol tartrate 50 MG tablet Commonly known as:  LOPRESSOR Take 1 tablet (50 mg total) by mouth 2 (two) times daily. What changed:  medication strength  how much to take  when to take this   oxycodone 5 MG capsule Commonly known as:  OXY-IR Take 5 mg by mouth every 6 (six) hours as needed for pain.   PRO-STAT PO Take 30 mLs by mouth 2 (two) times daily.   vitamin C 500 MG tablet Commonly known as:  ASCORBIC ACID Take 500 mg by mouth 2 (two) times daily.   zinc sulfate 220 (50 Zn) MG capsule Take 220 mg by mouth daily.       Disposition and follow-up:   Alex Abbott [facility].  At the hospital follow up visit please address:  1.   Focus on pain management. Follow his CBC. Focus on nutrition, consider meal supplements for better wound healing.  Consider palliative consult.   2.  Labs / imaging needed at time of follow-up: CBC to trend leukocytosis (ID believes this is  due wound care)  3.  Pending labs/ test needing follow-up: N/A  Follow-up Appointments: Follow-up Information    Azalee Course, [place] Follow up on 07/15/2017.   Specialties:  Cardiology, Radiology Why:  9:00 AM for follow up Contact information: [address] [place] [address] [phone]          Hospital Course by problem list: Principal Problem:   Fournier's gangrene Active Problems:   Buttock wound   Pressure injury of skin   Permanent atrial fibrillation with RVR (HCC)   1. 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 Alex Abbott is a 66 y.o. Male with a PMHx significant for Fournier's  Gangrene diagnosed on 0/7/12/2016 after presenting to [facility] in septic shock. He subsequently underwent extensive perineal, scrotal, and perianal debridement. He a total of 5 debridements prior to discharge from [place]. He was discharged from the facility in stable condition, and transferred to [facility] in [facility]. He was transferred from [facility] here at [facility] to the ED for evaluation for possible diverting colostomy. Surgery was consulted in the ED, and he subsequently went laparoscopic diverting colostomy on 06/24/2017. S/p surgery the patient was found to be in A-fib with RVR, rate control occurred with Metoprolol 50 mg BID. There were no other post-op complications. His ostomy had good output and he was subsequently discharged back to [facility] for further evaluation and management. Based on ID recommendations, we suggest discontinuing antibiotic therapy and palliative care consult. Continue wound care and ostomy care.   2. A-fib with RVR. S/p laparoscopic diverting colostomy Alex Abbott was found to be in A-fb with RVR. His PMHx was significant for A-fib with rate control on Metoprolol 12.5 mg QID. He was switched to Metoprolol 50 mg BID and Metoprolol 2.5mg  PRN for HR >120 and only if SBP >=110. He was still tachycardic throughout his hospitalization, therefore, it is appropriate to attempt further rate control or adding rhythm control.   3. Type 2 Diabetes Mellitus, Uncontrolled. Alex Abbott recent A1c in 01/2017 was 13.8. Repeat A1c on 06/23/2017 was 6.9. This is likely a result of his decreased oral intake. During his hospitalization he was managed with Lantus 20 units QHS and SSI-M. He required a total daily dose of 31 untis (20 units HS, base 3 TID with meals). This did not adequately control his blood sugar levels. He will need escalation in therapy.   4. Malnutrition. Alex Abbott albumin on admission was 1.2, and his A1c has dropped frm 13.6 to 6.9 in approximately 4 months. This  is likely reflective of his more PO intake. Nutrition was consulted, and recommended addition of vitamin C and zinc. His poor nutritional status contributes to his poor wound healing. We recommend continued vitamin/mineral supplementation and meal supplementation.   Discharge Vitals:   BP (!) 132/108 (BP Location: Left Arm)   Pulse (!) 107   Temp 98 F (36.7 C) (Oral)   Resp (!) 22   Ht 6' (1.829 m)   Wt 280 lb 6.8 oz (127.2 kg)   SpO2 94%   BMI 38.03 kg/m   Pertinent Labs, Studies, and Procedures:  Chest X-ray  IMPRESSION: No postprocedure complication following right-sided PICC line placement. The tip of the catheter however terminates at the level of the junction of the right and left brachiocephalic veins.  Cardiomegaly with central pulmonary vascular prominence may reflect mild CHF.  Discharge Instructions: Discharge Instructions    Diet - low sodium heart healthy  Complete by:  As directed    Increase activity slowly    Complete by:  As directed       Signed: Levora DredgeHelberg, Justin, MD 06/27/2017, 4:28 PM   My Pager: [phone]

## 2017-06-27 NOTE — Progress Notes (Signed)
   Subjective: Mr. Gilford RileHand is in significant pain this AM. He is having his wounds packed and moved. States that he has not been getting his pain medication as prescribed. Discussed the treatment plan to contact ID about possible Pseudomonas infection (green-gray wound drainage). Currently on Zosyn. Waiting on placement. He is agreeable with the plan however, continues to ruminate on pain and does not want to deal with it anymore.   Objective: Vital signs in last 24 hours: Vitals:   06/26/17 1200 06/26/17 1600 06/26/17 2000 06/27/17 0544  BP: 105/75 111/87 114/63 109/69  Pulse: 96 (!) 101 (!) 101 (!) 111  Resp: 19 (!) 27 (!) 21 20  Temp: 98.1 F (36.7 C) 98.6 F (37 C) 99 F (37.2 C) 98.2 F (36.8 C)  TempSrc: Oral Oral Oral Oral  SpO2: 94%     Weight:      Height:       Physical Exam  Constitutional: He is oriented to person, place, and time and well-developed, well-nourished, and in no distress.  HENT:  Head: Normocephalic and atraumatic.  Eyes: Pupils are equal, round, and reactive to light. Conjunctivae are normal.  Cardiovascular: Intact distal pulses.   Irregular rhythm, difficult to auscultate due to body habitus  Pulmonary/Chest: Effort normal and breath sounds normal. He has no wheezes. He has no rales.  Abdominal: Soft. Bowel sounds are normal.  Ostomy producing  Musculoskeletal: He exhibits no edema.  Neurological: He is alert and oriented to person, place, and time.  Skin:  Dressing changes while in room, sacral wound appears necrotic, wound care notes green-gray drainage, no frank pus   Assessment/Plan: Principal Problem:   Fournier's gangrene Active Problems:   Buttock wound   Pressure injury of skin   Permanent atrial fibrillation with RVR (HCC)  1. Chronic Non-healing Fournier Gangrene  - S/p surgery for diverting ostomy 7/27  - Surgery following; checking a pre-albumin, appreciate the recommendations  - Afebrile but continued leukocytosis  - Green-gray  discharge; possible Pseudomonas, continue Zosyn therapy  - Will consult ID - [facility] (social work involved) - Continue wound care - Continue pain management with Morphine 2mg  q6hrs and Oxy IR 5 mg q6hrs  2. Type 2 DM - Continue Lantus 20 units HS - Continue SSI-M  3. Nutritional Deficits  - Low albumin and muscle mass  - Surgery checking pre-albumin  - Continue zinc and Vitamin C  4. A-Fib - Rate control with Metoprolol 50 mg BID - IV metoprolol 2.5 mg prn HR >120 and SBP >=110  Dispo: Anticipated discharge in approximately >1 day(s).   Levora DredgeHelberg, Shakhia Gramajo, MD 06/27/2017, 8:52 AM My Pager: [phone]

## 2017-06-27 NOTE — Progress Notes (Signed)
3 Days Post-Op    CC:  Request for diverting colostomy  Subjective: Pt reports ongoing pain and says he cannot stand the therapy and dressing changes.  He has told Medicine he does not want to go back to [facility].  He needs ongoing dressing changes and he notes it takes an hour to do them.  His ostomy is working covered with stool, so I can't really see it now.   Objective: Vital signs in last 24 hours: Temp:  [98.1 F (36.7 C)-99 F (37.2 C)] 98.2 F (36.8 C) (07/30 0544) Pulse Rate:  [96-111] 111 (07/30 0544) Resp:  [19-27] 20 (07/30 0544) BP: (105-114)/(63-87) 109/69 (07/30 0544) SpO2:  [94 %] 94 % (07/29 1200) Last BM Date: 06/26/17  780 PO 150 IV 1300 urine No BM recorded Afebrile, VSS WBC is up to 18K  Significant ongoing anemia  Intake/Output from previous day: 07/29 0701 - 07/30 0700 In: 930 [P.O.:780; IV Piggyback:150] Out: 1300 [Urine:1300] Intake/Output this shift: No intake/output data recorded.  General appearance: alert, cooperative and no distress Resp: clear to auscultation bilaterally and anterior GI: abdominal wound OK, staple in. Ostomy working but I cannot see it.   Lab Results:   Recent Labs  06/26/17 0945 06/27/17 0521  WBC 16.3* 18.0*  HGB 7.0* 7.3*  HCT 21.9* 23.9*  PLT 330 377    BMET  Recent Labs  06/27/17 0521  NA 134*  K 4.8  CL 100*  CO2 29  GLUCOSE 193*  BUN 16  CREATININE 1.18  CALCIUM 8.1*   PT/INR No results for input(s): LABPROT, INR in the last 72 hours.   Recent Labs Lab 06/24/17 0353 06/27/17 0521  AST 28 20  ALT 37 17  ALKPHOS 183* 150*  BILITOT 0.5 0.5  PROT 5.2* 5.1*  ALBUMIN 1.2* 1.3*     Lipase  No results found for: LIPASE   Medications: . aspirin  81 mg Oral Daily  . atorvastatin  40 mg Oral Daily  . docusate sodium  100 mg Oral BID  . enoxaparin (LOVENOX) injection  40 mg Subcutaneous Q24H  . famotidine  20 mg Oral BID  . FLUoxetine  20 mg Oral BID  . insulin aspart  0-15 Units  Subcutaneous TID WC  . insulin glargine  20 Units Subcutaneous QHS  . metoprolol tartrate  50 mg Oral BID  . vitamin C  500 mg Oral BID  . zinc sulfate  220 mg Oral Daily   . piperacillin-tazobactam (ZOSYN)  IV Stopped (06/27/17 16100620)   Anti-infectives    Start     Dose/Rate Route Frequency Ordered Stop   06/24/17 1730  piperacillin-tazobactam (ZOSYN) IVPB 3.375 g     3.375 g 12.5 mL/hr over 240 Minutes Intravenous Every 8 hours 06/24/17 1715     06/24/17 1100  ceFAZolin (ANCEF) 3 g in dextrose 5 % 50 mL IVPB     3 g 130 mL/hr over 30 Minutes Intravenous To [facility] 06/24/17 1038 06/24/17 1133   06/24/17 0600  cefoTEtan in Dextrose 5% (CEFOTAN) IVPB 2 g  Status:  Discontinued     2 g Intravenous On call to O.R. 06/23/17 1355 06/23/17 1705       Assessment/Plan Fournier's gangrene with extensive invasion of the perineum, the penis,left and right buttocks, and the sacrum Sepsis secondary to Fournier's Spinal cord infarction with paraplegia, no motor or sensation below the umbilicus. 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 Chronic atrial fibrillation - aspirin currently for  anticoagulation he has declined other anticoagulants CAD - no reported history of MI or prior interventional treatments. Type 2 diabetes - poorly controlled. He reports glucoses in the 500 range prior to hospitalization Hypertension Hyperlipidemia Morbid obesity Probable sleep apnea History of heavy tobacco and alcohol use in the past Remote history of drug use History of bipolar disorder and anxiety disorder. Acute anemia Hyponatremia Stage IV sacral decubitus ulcer Anemia Malnutrition FEN:  Carb Mod diet ID: Cefazolin Pre op x 1;  Zosyn 7/27 =>> day 4 DVT:  Lovenox   Plan:  Pt is depressed and says he cannot stand therapy with dressing changes. WBC is up.  He has hydrotherapy and dressing changes ordered.  He suggest we just leave it be and do nothing.  I suggested  he discuss Palliative evaluation so he can explore options, including better pain control with dressing changes.  I put in another consult with Wound care, I don't see a note since his marking.  I ordered a prealbumin, and may want to consider transfusing. I doubt he is meeting nutritional goals.   Rx for situational depression may also be considered. From [facility] standpoint [facility]       LOS: 4 days    Marlicia Sroka 06/27/2017 [phone]

## 2017-06-27 NOTE — Consult Note (Signed)
[facility]       Reason for Consult: wound infection    Referring Physician: dr. Heide SparkNarendra  Principal Problem:   Fournier's gangrene Active Problems:   Buttock wound   Pressure injury of skin   Permanent atrial fibrillation with RVR (HCC)   . aspirin  81 mg Oral Daily  . atorvastatin  40 mg Oral Daily  . docusate sodium  100 mg Oral BID  . enoxaparin (LOVENOX) injection  40 mg Subcutaneous Q24H  . famotidine  20 mg Oral BID  . FLUoxetine  20 mg Oral BID  . insulin aspart  0-15 Units Subcutaneous TID WC  . insulin glargine  20 Units Subcutaneous QHS  . metoprolol tartrate  50 mg Oral BID  . vitamin C  500 mg Oral BID  . zinc sulfate  220 mg Oral Daily    Recommendations: Stop zosyn Continue supportive care Consider Palliative care   Assessment: He has a history of Fournier's gangrene now requiring wound care that is extremely uncomfortable for the patient.  He also has a report of foul smelling drainage per the primary team and concern for Pseudomonas infection.  He started hydrotherapy today by PT.  For his significant wounds with superficial purulence, blue/green in color and some necrotic areas, this requires extensive debridement, hydrotherapy, followed by grafting at some point.  There is no real role for antibiotics in treatment.  Antibiotics would be indicated if he had surrounding cellulitis or a pus pocket that is draining which he reportedly does not.  I was unable to examine due to discomfort.  Leukocytosis - you can see a rise in leukocytes with wound care with necrotic tissue.  As above, the treatment needs to focus on wound care.    Pain - the patient expressed to me, as he has to others, that he prefers to focus on comfort since he understands that he would need extensive wound care over the coming months, along with nutrition, to get to a point where he could start to heal.   Antibiotics: piptazo  HPI: Alex Abbott is a 66  y.o. male with a recent history of Fournier's gangrene treated at [facility] and discharged to [facility] on 7/19 who was transferred here for a diverting colostomy to avoid wound contamination.  He has a significant amount of pain with wound care.  He has had some elevation in his WBC and concerns as above noted.  Started empirically on pip tazo.  The patient expressed his desire now for a focus on comfort and does not feel he can continue to endure dressing changes/wound care for a prolonged period.     Review of Systems:  Constitutional: negative for fevers and chills Gastrointestinal: negative for diarrhea Integument/breast: negative for rash All other systems reviewed and are negative    Past Medical History:  Diagnosis Date  . Atrial fibrillation (HCC)   . Buttock wound 05/2017  . Diabetes mellitus without complication (HCC)   . Gangrene (HCC)    Fournier's gangrene  . HTN (hypertension)   . Permanent atrial fibrillation (HCC) 06/24/2017  . Septic shock (HCC) 05/2017    Social History  Substance Use Topics  . Smoking status: Former Smoker    Quit date: 05/10/2017  . Smokeless tobacco: Never Used  . Alcohol use No     Comment: RARE    Family History  Problem Relation Age of Onset  . Valvular heart disease Mother [id number]  . Alcohol abuse  Father [id number]  . CAD Neg Hx        Grandparents    Allergies  Allergen Reactions  . Propafenone Other (See Comments)    Weak     Physical Exam: Constitutional: in no apparent distress  Vitals:   06/27/17 0800 06/27/17 1200  BP: 102/78   Pulse: (!) 107   Resp: 19   Temp: 97.7 F (36.5 C) 98 F (36.7 C)   EYES: anicteric ENMT: no thrush Cardiovascular: Cor RRR Respiratory: CTA B, anterior exam; normal respiratory effort GI: Bowel sounds are normal, liver is not enlarged, spleen is not enlarged Musculoskeletal: no pedal edema noted Skin: negatives: no rash   Lab Results  Component Value Date   WBC 18.0 (H) 06/27/2017   HGB 7.3 (L)  06/27/2017   HCT 23.9 (L) 06/27/2017   MCV 95.2 06/27/2017   PLT 377 06/27/2017    Lab Results  Component Value Date   CREATININE 1.18 06/27/2017   BUN 16 06/27/2017   NA 134 (L) 06/27/2017   K 4.8 06/27/2017   CL 100 (L) 06/27/2017   CO2 29 06/27/2017    Lab Results  Component Value Date   ALT 17 06/27/2017   AST 20 06/27/2017   ALKPHOS 150 (H) 06/27/2017     Microbiology: Recent Results (from the past 240 hour(s))  Culture, Urine     Status: None   Collection Time: 06/20/17  2:38 AM  Result Value Ref Range Status   Specimen Description URINE, RANDOM  Final   Special Requests NONE  Final   Culture NO GROWTH  Final   Report Status 06/21/2017 FINAL  Final  Surgical pcr screen     Status: None   Collection Time: 06/23/17 11:30 PM  Result Value Ref Range Status   MRSA, PCR NEGATIVE NEGATIVE Final   Staphylococcus aureus NEGATIVE NEGATIVE Final    Comment:        The Xpert SA Assay (FDA approved for NASAL specimens in patients over 64 years of age), is one component of a comprehensive surveillance program.  Test performance has been validated by [facility] for patients greater than or equal to 64 year old. It is not intended to diagnose infection nor to guide or monitor treatment.   Culture, blood (routine x 2)     Status: None (Preliminary result)   Collection Time: 06/24/17  3:36 PM  Result Value Ref Range Status   Specimen Description BLOOD LEFT [place]  Final   Special Requests IN PEDIATRIC BOTTLE Blood Culture adequate volume  Final   Culture NO GROWTH 2 DAYS  Final   Report Status PENDING  Incomplete  Culture, blood (routine x 2)     Status: None (Preliminary result)   Collection Time: 06/24/17  3:40 PM  Result Value Ref Range Status   Specimen Description BLOOD LEFT THUMB  Final   Special Requests IN PEDIATRIC BOTTLE Blood Culture adequate volume  Final   Culture NO GROWTH 2 DAYS  Final   Report Status PENDING  Incomplete    Staci RighterOMER, Dasean Brow, MD [facility] [email or web address] [phone] pager  [phone] cell 06/27/2017, 2:39 PM

## 2017-06-27 NOTE — Care Management Note (Signed)
Case Management Note Donn PieriniKristi Aritza Brunet RN, BSN Unit 4E-Case Manager [phone]  Patient Details  Name: Alex Abbott MRN: [id number] Date of Birth: 12-Mar-1951  Subjective/Objective:   Pt admitted from [facility] for non-healing wound LTACH requested evaluation for possible diverting colostomy.- pt s/p Laparoscopic-assisted creation of end sigmoid colostomy on 06/24/17                   Action/Plan: [facility]- having spoken with Alcario DroughtErica at [facility] as pt is on LOA with [facility] expect to return to [facility]- will f/u on Monday regarding return- [facility] can not accept pt today 7/29- CM will f/u in AM  Expected Discharge Date:                  Expected Discharge Plan:  Long Term Acute Care (LTAC)  In-House Referral:     Discharge planning Services  CM Consult  Post Acute Care Choice:  NA Choice offered to:  NA  DME Arranged:    DME Agency:     HH Arranged:    HH Agency:     Status of Service:  Completed, signed off  If discussed at [employer] Length of Stay Meetings, dates discussed:    Discharge Disposition: LTACH- [facility]  Additional Comments:  06/27/17- 1100- [name] RN, CM- received word that pt did not want to return to [facility]- have spoken with [facility] - Clydie BraunKaren- and they can take pt back today if medically stable. - spoke with pt at bedside regarding pt's concerns regarding [facility]- pt voiced concern over not being able to progress and pain control. Pt expressed wanting to be comfortable and states "i'm dying, and not going to get better"- discussed with pt that he was at [facility]  for extensive wound care- and to get to a point to progress with therapies- pt voices that he is agreeable to return to [facility] - willing to speak with Clydie BraunKaren from [facility] regarding his concerns on pain management and being able to sit up to eat. Clydie BraunKaren came to bedside and spoke with pt along with CM- and addressed pt's concerns- Pt remains agreeable to return to [facility] for further treatment for  wounds at this time. Per MD waiting on ID consult for elevated WBC- plan to tx pt back to [facility] once cleared by medical team. [facility] on standby to accept pt.   Darrold SpanWebster, [name] Hall, RN 06/27/2017, 4:06 PM

## 2017-06-27 NOTE — Consult Note (Addendum)
WOC Nurse ostomy follow up Buttocks and sacrum wounds are being followed by surgical team.  Stoma type/location: Current pouch is leaking stool behind the barrier. Stomal assessment/size: Stoma is 10% eschar, 90% loose slough, flush with skin level, 1 inch and oval Peristomal assessment: Intact skin surrounding Output: Mod amt semi-formed brown stool,  Ostomy pouching: 2pc.  Education provided:  Demonstrated pouch change and discussed pouching routines; pt did not watch or ask questions. Applied 2 piece pouch with barrier ring to attempt to maintain a seal. He will require total assistance with pouch application and emptying when he is discharged. Enrolled [place] in [facility] [facility] [place] Dis[place] program: No Extra supplies at the bedside for staff nurses use. Cammie Mcgeeawn Bunnie Lederman MSN, RN, CWOCN, [place]-AP, CNS [phone]

## 2017-06-28 LAB — CBC WITH DIFFERENTIAL/PLATELET
Basophils Absolute: 0 10*3/uL (ref 0.0–0.1)
Basophils Relative: 0 %
Eosinophils Absolute: 0.2 10*3/uL (ref 0.0–0.7)
Eosinophils Relative: 1 %
HCT: 24.1 % — ABNORMAL LOW (ref 39.0–52.0)
Hemoglobin: 7.4 g/dL — ABNORMAL LOW (ref 13.0–17.0)
Lymphocytes Relative: 8 %
Lymphs Abs: 1.4 10*3/uL (ref 0.7–4.0)
MCH: 29 pg (ref 26.0–34.0)
MCHC: 30.7 g/dL (ref 30.0–36.0)
MCV: 94.5 fL (ref 78.0–100.0)
Monocytes Absolute: 1.6 10*3/uL — ABNORMAL HIGH (ref 0.1–1.0)
Monocytes Relative: 9 %
Neutro Abs: 14.8 10*3/uL — ABNORMAL HIGH (ref 1.7–7.7)
Neutrophils Relative %: 82 %
Platelets: 358 10*3/uL (ref 150–400)
RBC: 2.55 MIL/uL — ABNORMAL LOW (ref 4.22–5.81)
RDW: 16.1 % — ABNORMAL HIGH (ref 11.5–15.5)
WBC: 18 10*3/uL — ABNORMAL HIGH (ref 4.0–10.5)

## 2017-06-28 LAB — BASIC METABOLIC PANEL
Anion gap: 5 (ref 5–15)
BUN: 15 mg/dL (ref 6–20)
CO2: 29 mmol/L (ref 22–32)
Calcium: 8.1 mg/dL — ABNORMAL LOW (ref 8.9–10.3)
Chloride: 99 mmol/L — ABNORMAL LOW (ref 101–111)
Creatinine, Ser: 1.08 mg/dL (ref 0.61–1.24)
GFR calc Af Amer: >60 mL/min (ref >60)
GFR calc non Af Amer: >60 mL/min (ref >60)
Glucose, Bld: 177 mg/dL — ABNORMAL HIGH (ref 65–99)
Potassium: 4.4 mmol/L (ref 3.5–5.1)
Sodium: 133 mmol/L — ABNORMAL LOW (ref 135–145)

## 2017-06-29 LAB — CULTURE, BLOOD (ROUTINE X 2)
CULTURE: NO GROWTH
CULTURE: NO GROWTH
SPECIAL REQUESTS: ADEQUATE
SPECIAL REQUESTS: ADEQUATE

## 2017-07-01 LAB — BASIC METABOLIC PANEL
Anion gap: 6 (ref 5–15)
BUN: 16 mg/dL (ref 6–20)
CALCIUM: 8.5 mg/dL — AB (ref 8.9–10.3)
CHLORIDE: 100 mmol/L — AB (ref 101–111)
CO2: 28 mmol/L (ref 22–32)
CREATININE: 0.96 mg/dL (ref 0.61–1.24)
GFR calc Af Amer: >60 mL/min (ref >60)
Glucose, Bld: 153 mg/dL — ABNORMAL HIGH (ref 65–99)
Potassium: 4.5 mmol/L (ref 3.5–5.1)
SODIUM: 134 mmol/L — AB (ref 135–145)

## 2017-07-01 LAB — CBC
HCT: 25.3 % — ABNORMAL LOW (ref 39.0–52.0)
HEMOGLOBIN: 7.9 g/dL — AB (ref 13.0–17.0)
MCH: 29.7 pg (ref 26.0–34.0)
MCHC: 31.2 g/dL (ref 30.0–36.0)
MCV: 95.1 fL (ref 78.0–100.0)
Platelets: 310 10*3/uL (ref 150–400)
RBC: 2.66 MIL/uL — ABNORMAL LOW (ref 4.22–5.81)
RDW: 16.4 % — ABNORMAL HIGH (ref 11.5–15.5)
WBC: 17.5 10*3/uL — ABNORMAL HIGH (ref 4.0–10.5)

## 2017-07-04 LAB — CBC
HEMATOCRIT: 25.9 % — AB (ref 39.0–52.0)
Hemoglobin: 7.9 g/dL — ABNORMAL LOW (ref 13.0–17.0)
MCH: 28.3 pg (ref 26.0–34.0)
MCHC: 30.5 g/dL (ref 30.0–36.0)
MCV: 92.8 fL (ref 78.0–100.0)
PLATELETS: 361 10*3/uL (ref 150–400)
RBC: 2.79 MIL/uL — AB (ref 4.22–5.81)
RDW: 16 % — AB (ref 11.5–15.5)
WBC: 18.8 10*3/uL — AB (ref 4.0–10.5)

## 2017-07-04 LAB — URINALYSIS, ROUTINE W REFLEX MICROSCOPIC
Bilirubin Urine: NEGATIVE
GLUCOSE, UA: 50 mg/dL — AB
KETONES UR: NEGATIVE mg/dL
NITRITE: NEGATIVE
PROTEIN: 100 mg/dL — AB
Specific Gravity, Urine: 1.021 (ref 1.005–1.030)
pH: 6 (ref 5.0–8.0)

## 2017-07-04 LAB — BASIC METABOLIC PANEL
Anion gap: 10 (ref 5–15)
BUN: 20 mg/dL (ref 6–20)
CALCIUM: 8.6 mg/dL — AB (ref 8.9–10.3)
CO2: 24 mmol/L (ref 22–32)
CREATININE: 0.92 mg/dL (ref 0.61–1.24)
Chloride: 100 mmol/L — ABNORMAL LOW (ref 101–111)
GFR calc Af Amer: >60 mL/min (ref >60)
GLUCOSE: 148 mg/dL — AB (ref 65–99)
Potassium: 4.2 mmol/L (ref 3.5–5.1)
Sodium: 134 mmol/L — ABNORMAL LOW (ref 135–145)

## 2017-07-04 LAB — PROCALCITONIN: Procalcitonin: 0.26 ng/mL

## 2017-07-05 LAB — URINE CULTURE: Culture: NO GROWTH

## 2017-07-08 LAB — CBC WITH DIFFERENTIAL/PLATELET
BASOS PCT: 0 %
Basophils Absolute: 0 10*3/uL (ref 0.0–0.1)
EOS ABS: 0.2 10*3/uL (ref 0.0–0.7)
EOS PCT: 1 %
HEMATOCRIT: 24.7 % — AB (ref 39.0–52.0)
Hemoglobin: 7.5 g/dL — ABNORMAL LOW (ref 13.0–17.0)
Lymphocytes Relative: 6 %
Lymphs Abs: 1.1 10*3/uL (ref 0.7–4.0)
MCH: 28.7 pg (ref 26.0–34.0)
MCHC: 30.4 g/dL (ref 30.0–36.0)
MCV: 94.6 fL (ref 78.0–100.0)
MONOS PCT: 6 %
Monocytes Absolute: 1.1 10*3/uL — ABNORMAL HIGH (ref 0.1–1.0)
NEUTROS ABS: 15.2 10*3/uL — AB (ref 1.7–7.7)
Neutrophils Relative %: 87 %
PLATELETS: 413 10*3/uL — AB (ref 150–400)
RBC: 2.61 MIL/uL — ABNORMAL LOW (ref 4.22–5.81)
RDW: 16.4 % — AB (ref 11.5–15.5)
WBC: 17.6 10*3/uL — ABNORMAL HIGH (ref 4.0–10.5)

## 2017-07-08 LAB — BASIC METABOLIC PANEL
ANION GAP: 8 (ref 5–15)
BUN: 30 mg/dL — ABNORMAL HIGH (ref 6–20)
CALCIUM: 8.5 mg/dL — AB (ref 8.9–10.3)
CO2: 27 mmol/L (ref 22–32)
CREATININE: 0.97 mg/dL (ref 0.61–1.24)
Chloride: 101 mmol/L (ref 101–111)
Glucose, Bld: 187 mg/dL — ABNORMAL HIGH (ref 65–99)
Potassium: 4.1 mmol/L (ref 3.5–5.1)
SODIUM: 136 mmol/L (ref 135–145)

## 2017-07-08 LAB — MAGNESIUM: MAGNESIUM: 1.8 mg/dL (ref 1.7–2.4)

## 2017-07-08 LAB — PHOSPHORUS: PHOSPHORUS: 3.8 mg/dL (ref 2.5–4.6)

## 2017-07-15 ENCOUNTER — Ambulatory Visit: Payer: Medicare Other | Admitting: Physician Assistant

## 2017-07-15 NOTE — Progress Notes (Deleted)
Cardiology Office Note    Date:  07/15/2017   ID:  Darlina Guys, DOB 1951-03-18, MRN [id number]  PCP:  Patient, No Pcp Per  Cardiologist:  Dr. Herbie Baltimore   No chief complaint on file.   History of Present Illness:  Alex Abbott is a 66 y.o. male with PMH of HTN, HLD, DM II, CKD stage III, possible CAD (no prior testing), bipolar d/o, chronic pain and h/o persistent afib for 30 years. He was recently seen in the hospital on 06/23/2017 after being admitted from [facility] for evaluation of possible diverging colostomy because of fecal incontinence contributing to poor wound healing of his Fournier's gangrene of buttocks. He had laparoscopic-assisted creation of and sigmoid colostomy. During and after the procedure, his heart rate was elevated and cardiology was consulted. He has never been on anticoagulation in the past. He has no cardiac awareness. Eventually we decided on rate control.  Yes EKG   Past Medical History:  Diagnosis Date  . Atrial fibrillation (HCC)   . Buttock wound 05/2017  . Diabetes mellitus without complication (HCC)   . Gangrene (HCC)    Fournier's gangrene  . HTN (hypertension)   . Permanent atrial fibrillation (HCC) 06/24/2017  . Septic shock (HCC) 05/2017    Past Surgical History:  Procedure Laterality Date  . COLON RESECTION N/A 06/24/2017   Procedure: LAPAROSCOPIC ASSISSTED OSTOMY;  Surgeon: Berna Bue, MD;  Location: [facility] OR;  Service: General;  Laterality: N/A;  . COLOSTOMY Left 06/24/2017   Procedure: COLOSTOMY;  Surgeon: Berna Bue, MD;  Location: [facility] OR;  Service: General;  Laterality: Left;  . IRRIGATION AND DEBRIDEMENT BUTTOCKS    . TESTICLE SURGERY    . TONSILLECTOMY      Current Medications: Outpatient Medications Prior to Visit  Medication Sig Dispense Refill  . acetaminophen (TYLENOL) 325 MG tablet Take 650 mg by mouth every 6 (six) hours as needed for mild pain.    Marland Kitchen ALPRAZolam (XANAX) 0.5 MG tablet Take 0.5 mg by mouth 3 (three)  times daily as needed for anxiety.    . Amino Acids-Protein Hydrolys (PRO-STAT PO) Take 30 mLs by mouth 2 (two) times daily.    Marland Kitchen aspirin 325 MG tablet Take 325 mg by mouth daily.    Marland Kitchen atorvastatin (LIPITOR) 10 MG tablet Take 20 mg by mouth daily.     . famotidine (PEPCID) 20 MG tablet Take 20 mg by mouth 2 (two) times daily.    Marland Kitchen FLUoxetine (PROZAC) 20 MG capsule Take 20 mg by mouth 2 (two) times daily.    Marland Kitchen glucagon (GLUCAGON EMERGENCY) 1 MG injection Inject 1 mg into the vein once as needed.    . insulin glargine (LANTUS) 100 UNIT/ML injection Inject 20 Units into the skin 2 (two) times daily.    . insulin lispro (HUMALOG) 100 UNIT/ML injection Inject 0-16 Units into the skin See admin instructions. Per sliding scale for BS 70-150=0 units, 151-200=4 units, 201-250=8 units, 251-300=10 units, 301-350=12 units, 351-400=16 units, BG>400 give 16 units and call MD    . magnesium oxide (MAG-OX) 400 MG tablet Take 400 mg by mouth every 6 (six) hours as needed.    . metoprolol tartrate (LOPRESSOR) 50 MG tablet Take 1 tablet (50 mg total) by mouth 2 (two) times daily. 60 tablet 0  . oxycodone (OXY-IR) 5 MG capsule Take 5 mg by mouth every 6 (six) hours as needed for pain.    . vitamin C (ASCORBIC ACID) 500 MG tablet Take  500 mg by mouth 2 (two) times daily.    Marland Kitchen zinc sulfate 220 (50 Zn) MG capsule Take 220 mg by mouth daily.     No facility-administered medications prior to visit.      Allergies:   Propafenone   Social History   Social History  . Marital status: Married    Spouse name: N/A  . Number of children: N/A  . Years of education: N/A   Occupational History  . Retired    Social History Main Topics  . Smoking status: Former Smoker    Quit date: 05/10/2017  . Smokeless tobacco: Never Used  . Alcohol use No     Comment: RARE  . Drug use: No  . Sexual activity: Not on file   Other Topics Concern  . Not on file   Social History Narrative   Pt lives alone in [place], [place]       Family History:  The patient's ***family history includes Alcohol abuse (age of onset: 50) in his father; Valvular heart disease (age of onset: 6) in his mother.   ROS:   Please see the history of present illness.    ROS All other systems reviewed and are negative.   PHYSICAL EXAM:   VS:  There were no vitals taken for this visit.   GEN: Well nourished, well developed, in no acute distress  HEENT: normal  Neck: no JVD, carotid bruits, or masses Cardiac: ***RRR; no murmurs, rubs, or gallops,no edema  Respiratory:  clear to auscultation bilaterally, normal work of breathing GI: soft, nontender, nondistended, + BS MS: no deformity or atrophy  Skin: warm and dry, no rash Neuro:  Alert and Oriented x 3, Strength and sensation are intact Psych: euthymic mood, full affect  Wt Readings from Last 3 Encounters:  06/24/17 280 lb 6.8 oz (127.2 kg)      Studies/Labs Reviewed:   EKG:  EKG is*** ordered today.  The ekg ordered today demonstrates ***  Recent Labs: 06/27/2017: ALT 17 07/08/2017: BUN 30; Creatinine, Ser 0.97; Hemoglobin 7.5; Magnesium 1.8; Platelets 413; Potassium 4.1; Sodium 136   Lipid Panel No results found for: CHOL, TRIG, HDL, CHOLHDL, VLDL, LDLCALC, LDLDIRECT  Additional studies/ records that were reviewed today include:  ***    ASSESSMENT:    No diagnosis found.   PLAN:  In order of problems listed above:  1. ***    Medication Adjustments/Labs and Tests Ordered: Current medicines are reviewed at length with the patient today.  Concerns regarding medicines are outlined above.  Medication changes, Labs and Tests ordered today are listed in the Patient Instructions below. There are no Patient Instructions on file for this visit.   Ramond Dial, [place]  07/15/2017 7:19 AM    [facility] [address] [place], [place], [place]  [address] Phone: [phone]; Fax: [phone]

## 2017-08-29 DEATH — deceased

## 2019-03-22 IMAGING — DX DG CHEST 1V PORT
1 series · 1 of 1 positions shown · non-contrast
Comparison: None in PACs

CLINICAL DATA: Status post PICC line placement on the right.

EXAM:
PORTABLE CHEST 1 VIEW

[chest ap]
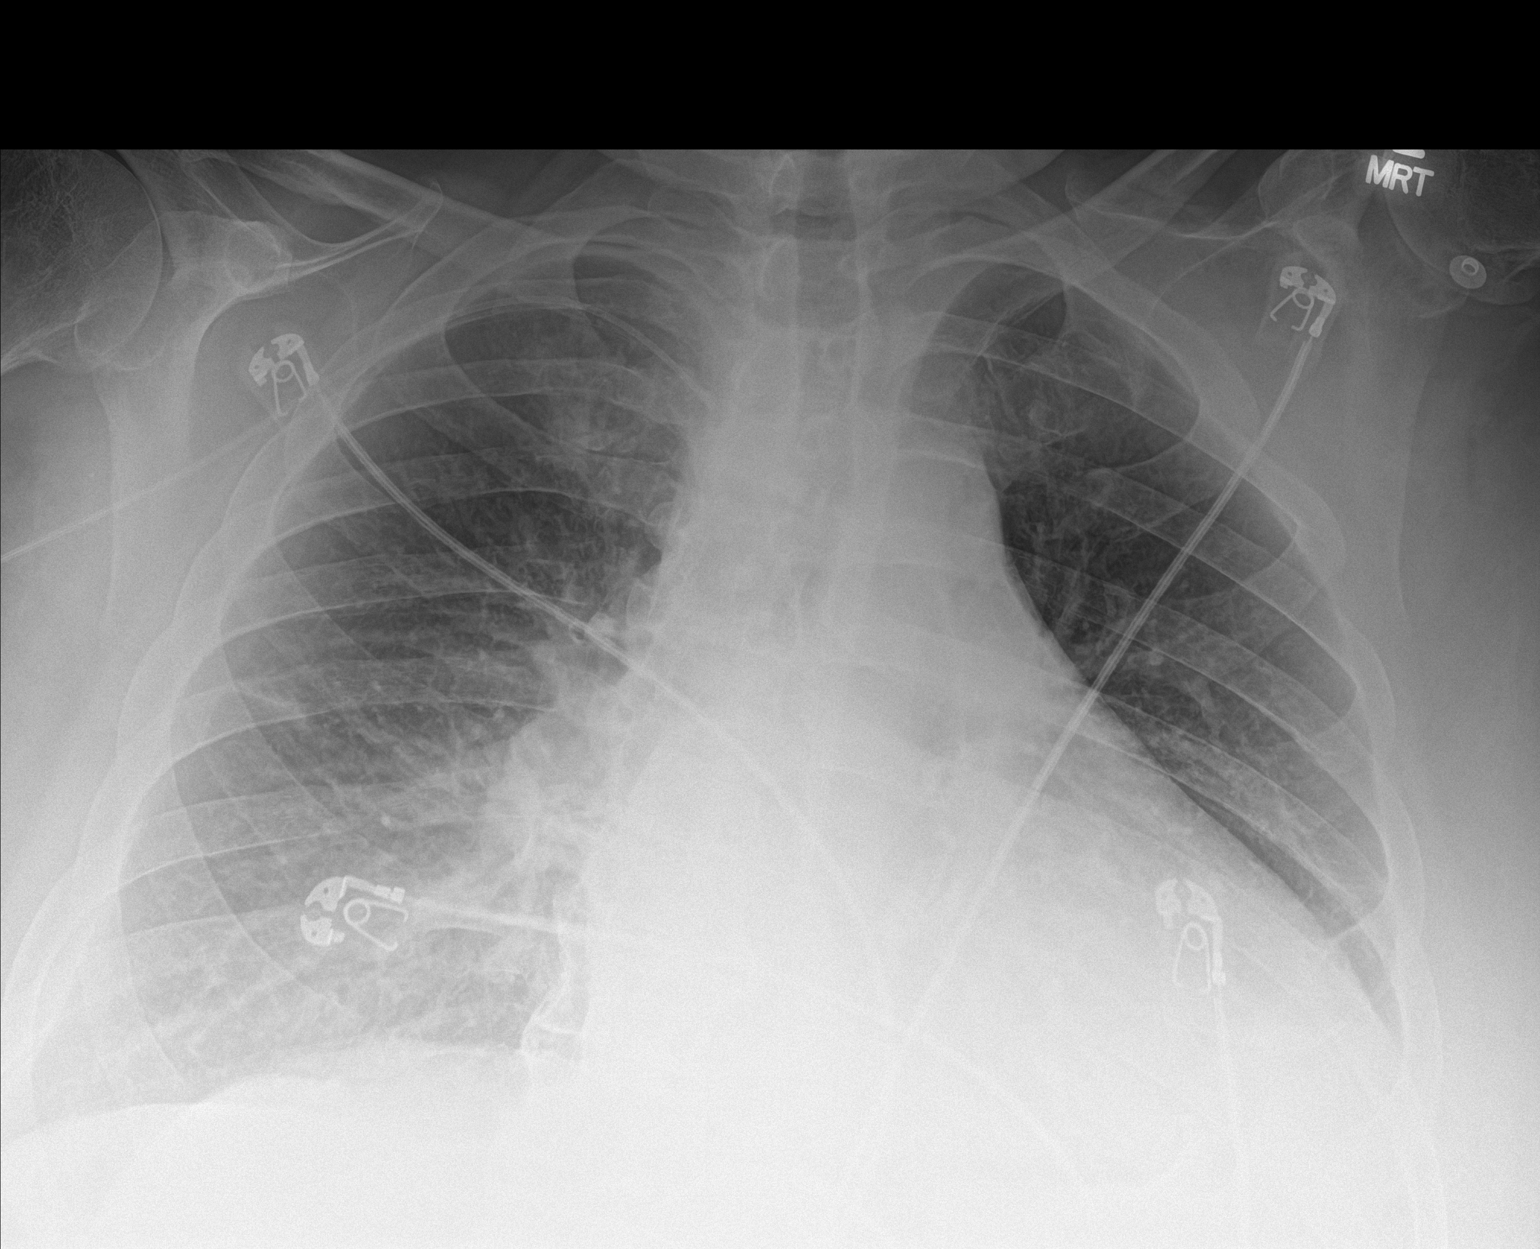

[1 of 1 positions shown; findings below may reference images not displayed]

FINDINGS: The PICC line tip terminates at the junction of the right and left
brachiocephalic veins. The lungs are adequately inflated. There is
no pneumothorax. The retrocardiac region on the left is dense. The
cardiac silhouette is enlarged. The pulmonary vascularity is mildly
prominent centrally.
IMPRESSION: No postprocedure complication following right-sided PICC line
placement. The tip of the catheter however terminates at the level
of the junction of the right and left brachiocephalic veins.

Cardiomegaly with central pulmonary vascular prominence may reflect
mild CHF.
